# Patient Record
Sex: Female | Born: 1965 | Race: White | Hispanic: No | Marital: Married | Smoking: Never smoker
Health system: Southern US, Community
[De-identification: ages and names within clinical notes are randomized; demographics above are authoritative.]

## PROBLEM LIST (undated history)

## (undated) DIAGNOSIS — J45909 Unspecified asthma, uncomplicated: Secondary | ICD-10-CM

## (undated) DIAGNOSIS — T7840XA Allergy, unspecified, initial encounter: Secondary | ICD-10-CM

## (undated) DIAGNOSIS — E119 Type 2 diabetes mellitus without complications: Secondary | ICD-10-CM

## (undated) DIAGNOSIS — K219 Gastro-esophageal reflux disease without esophagitis: Secondary | ICD-10-CM

## (undated) DIAGNOSIS — Z8041 Family history of malignant neoplasm of ovary: Secondary | ICD-10-CM

## (undated) DIAGNOSIS — R51 Headache: Secondary | ICD-10-CM

## (undated) DIAGNOSIS — I10 Essential (primary) hypertension: Secondary | ICD-10-CM

## (undated) DIAGNOSIS — Z800 Family history of malignant neoplasm of digestive organs: Secondary | ICD-10-CM

## (undated) DIAGNOSIS — Z973 Presence of spectacles and contact lenses: Secondary | ICD-10-CM

## (undated) DIAGNOSIS — J069 Acute upper respiratory infection, unspecified: Secondary | ICD-10-CM

## (undated) DIAGNOSIS — Z8051 Family history of malignant neoplasm of kidney: Secondary | ICD-10-CM

## (undated) DIAGNOSIS — Z803 Family history of malignant neoplasm of breast: Secondary | ICD-10-CM

## (undated) HISTORY — DX: Unspecified asthma, uncomplicated: J45.909

## (undated) HISTORY — DX: Allergy, unspecified, initial encounter: T78.40XA

## (undated) HISTORY — DX: Family history of malignant neoplasm of ovary: Z80.41

## (undated) HISTORY — PX: DILATION AND CURETTAGE OF UTERUS: SHX78

## (undated) HISTORY — DX: Family history of malignant neoplasm of breast: Z80.3

## (undated) HISTORY — DX: Family history of malignant neoplasm of digestive organs: Z80.0

## (undated) HISTORY — DX: Family history of malignant neoplasm of kidney: Z80.51

## (undated) HISTORY — DX: Type 2 diabetes mellitus without complications: E11.9

## (undated) HISTORY — DX: Essential (primary) hypertension: I10

## (undated) HISTORY — PX: BREAST SURGERY: SHX581

## (undated) HISTORY — PX: ABDOMINAL HYSTERECTOMY: SHX81

## (undated) HISTORY — PX: NASAL SINUS SURGERY: SHX719

---

## 1998-04-20 ENCOUNTER — Other Ambulatory Visit: Admission: RE | Admit: 1998-04-20 | Discharge: 1998-04-20 | Payer: Self-pay | Admitting: Obstetrics & Gynecology

## 2001-03-18 ENCOUNTER — Other Ambulatory Visit: Admission: RE | Admit: 2001-03-18 | Discharge: 2001-03-18 | Payer: Self-pay | Admitting: Obstetrics and Gynecology

## 2001-05-16 ENCOUNTER — Encounter (INDEPENDENT_AMBULATORY_CARE_PROVIDER_SITE_OTHER): Payer: Self-pay | Admitting: Specialist

## 2001-05-16 ENCOUNTER — Ambulatory Visit (HOSPITAL_COMMUNITY): Admission: RE | Admit: 2001-05-16 | Discharge: 2001-05-16 | Payer: Self-pay | Admitting: Obstetrics and Gynecology

## 2002-03-27 ENCOUNTER — Other Ambulatory Visit: Admission: RE | Admit: 2002-03-27 | Discharge: 2002-03-27 | Payer: Self-pay | Admitting: Obstetrics and Gynecology

## 2003-06-04 ENCOUNTER — Other Ambulatory Visit: Admission: RE | Admit: 2003-06-04 | Discharge: 2003-06-04 | Payer: Self-pay | Admitting: Obstetrics and Gynecology

## 2003-08-12 ENCOUNTER — Ambulatory Visit (HOSPITAL_COMMUNITY): Admission: RE | Admit: 2003-08-12 | Discharge: 2003-08-12 | Payer: Self-pay | Admitting: Otolaryngology

## 2004-06-09 ENCOUNTER — Other Ambulatory Visit: Admission: RE | Admit: 2004-06-09 | Discharge: 2004-06-09 | Payer: Self-pay | Admitting: Obstetrics and Gynecology

## 2005-06-08 ENCOUNTER — Other Ambulatory Visit: Admission: RE | Admit: 2005-06-08 | Discharge: 2005-06-08 | Payer: Self-pay | Admitting: Obstetrics and Gynecology

## 2005-06-11 ENCOUNTER — Other Ambulatory Visit: Admission: RE | Admit: 2005-06-11 | Discharge: 2005-06-11 | Payer: Self-pay | Admitting: Radiology

## 2005-12-12 ENCOUNTER — Ambulatory Visit (HOSPITAL_COMMUNITY): Admission: RE | Admit: 2005-12-12 | Discharge: 2005-12-12 | Payer: Self-pay | Admitting: Pulmonary Disease

## 2007-03-04 ENCOUNTER — Ambulatory Visit (HOSPITAL_COMMUNITY): Admission: RE | Admit: 2007-03-04 | Discharge: 2007-03-04 | Payer: Self-pay | Admitting: Pulmonary Disease

## 2007-10-15 ENCOUNTER — Emergency Department (HOSPITAL_COMMUNITY): Admission: EM | Admit: 2007-10-15 | Discharge: 2007-10-15 | Payer: Self-pay | Admitting: Emergency Medicine

## 2007-10-28 ENCOUNTER — Inpatient Hospital Stay (HOSPITAL_COMMUNITY): Admission: RE | Admit: 2007-10-28 | Discharge: 2007-10-29 | Payer: Self-pay | Admitting: Obstetrics and Gynecology

## 2007-10-28 ENCOUNTER — Encounter (INDEPENDENT_AMBULATORY_CARE_PROVIDER_SITE_OTHER): Payer: Self-pay | Admitting: Obstetrics and Gynecology

## 2008-04-26 IMAGING — CR DG HAND COMPLETE 3+V*L*
3 series · 3 of 3 positions shown · non-contrast
Comparison: none

CLINICAL DATA: Left hand pain and swelling.   Injury yesterday. 
 LEFT HAND ? 3 VIEW:

[view not recorded (1 of 3)]
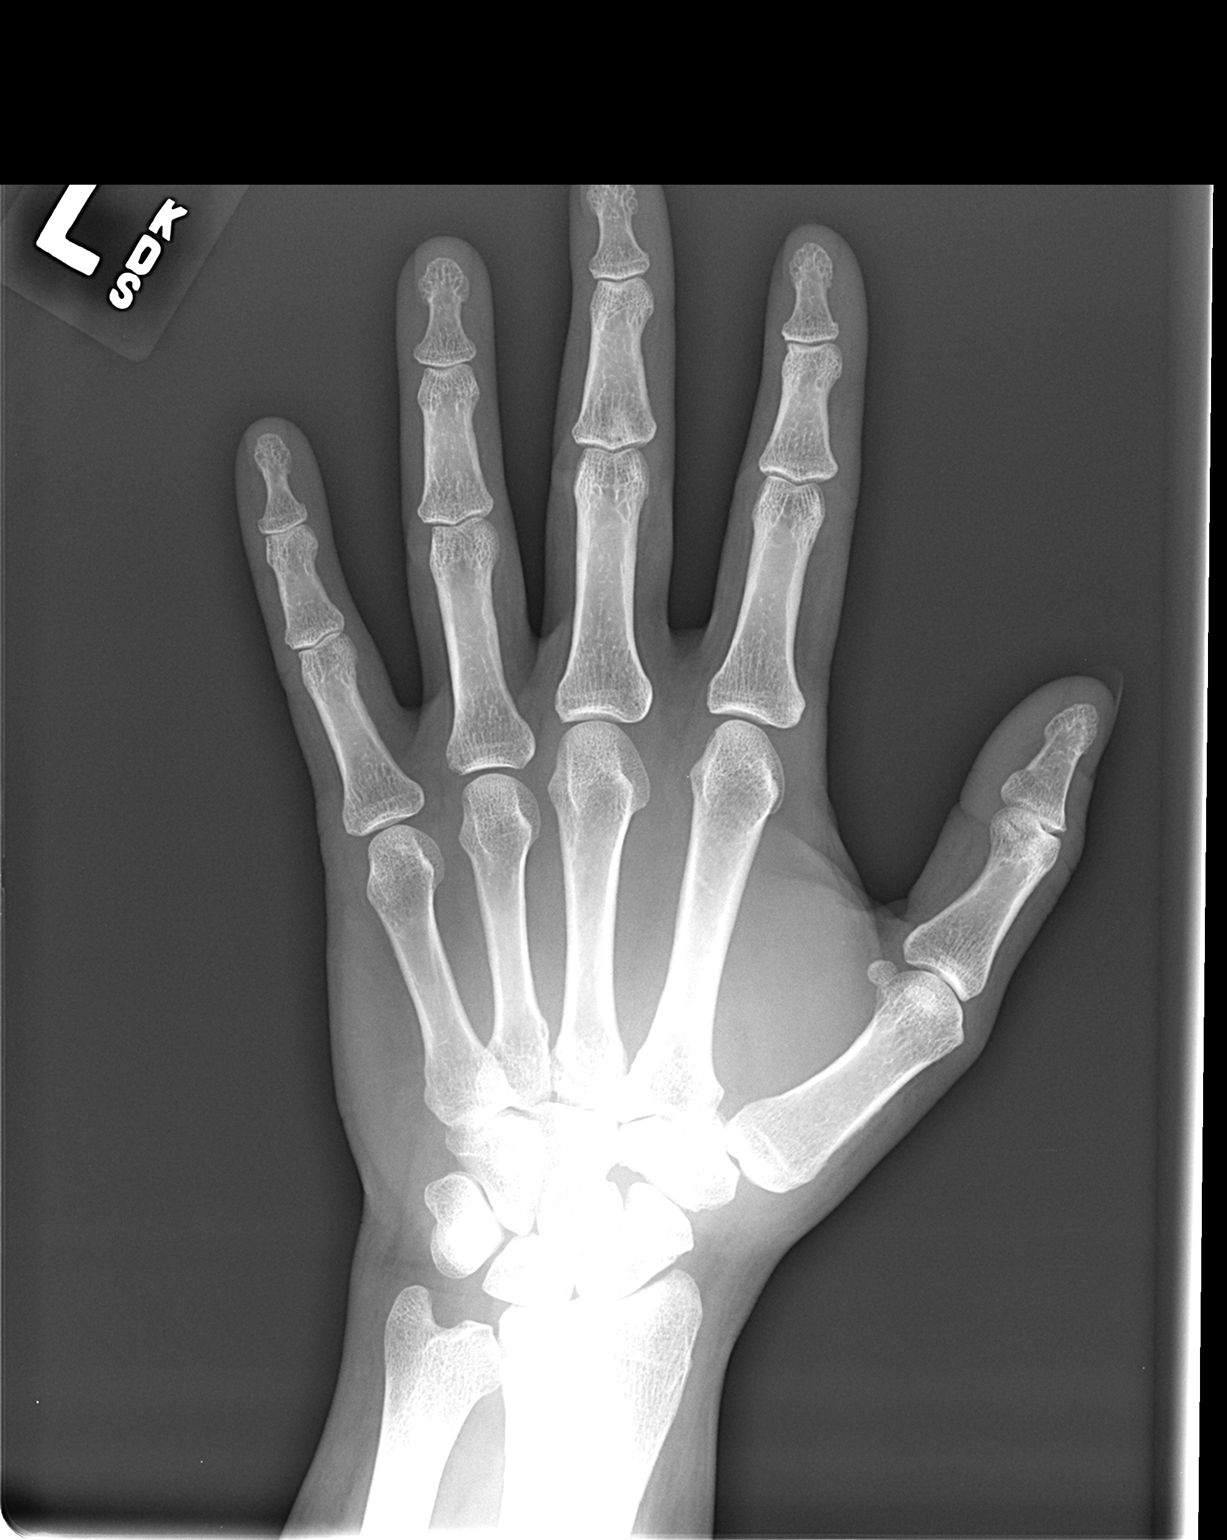

[view not recorded (2 of 3)]
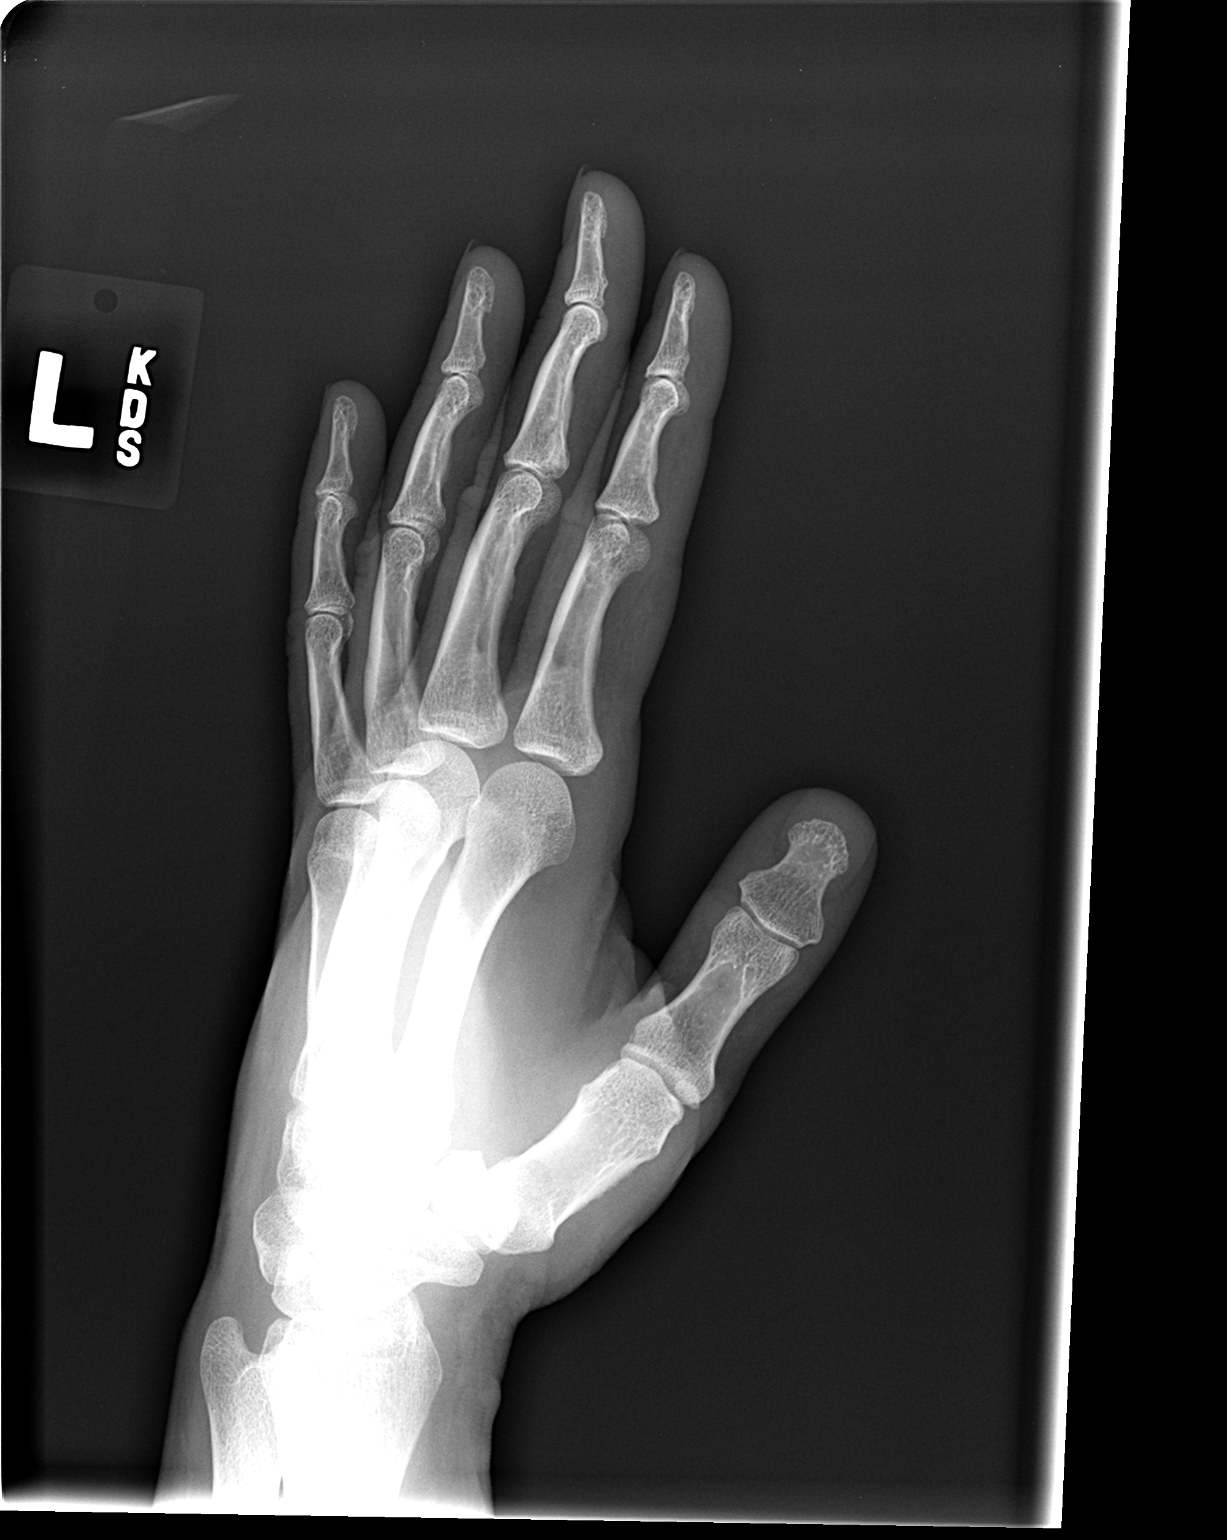

[view not recorded (3 of 3)]
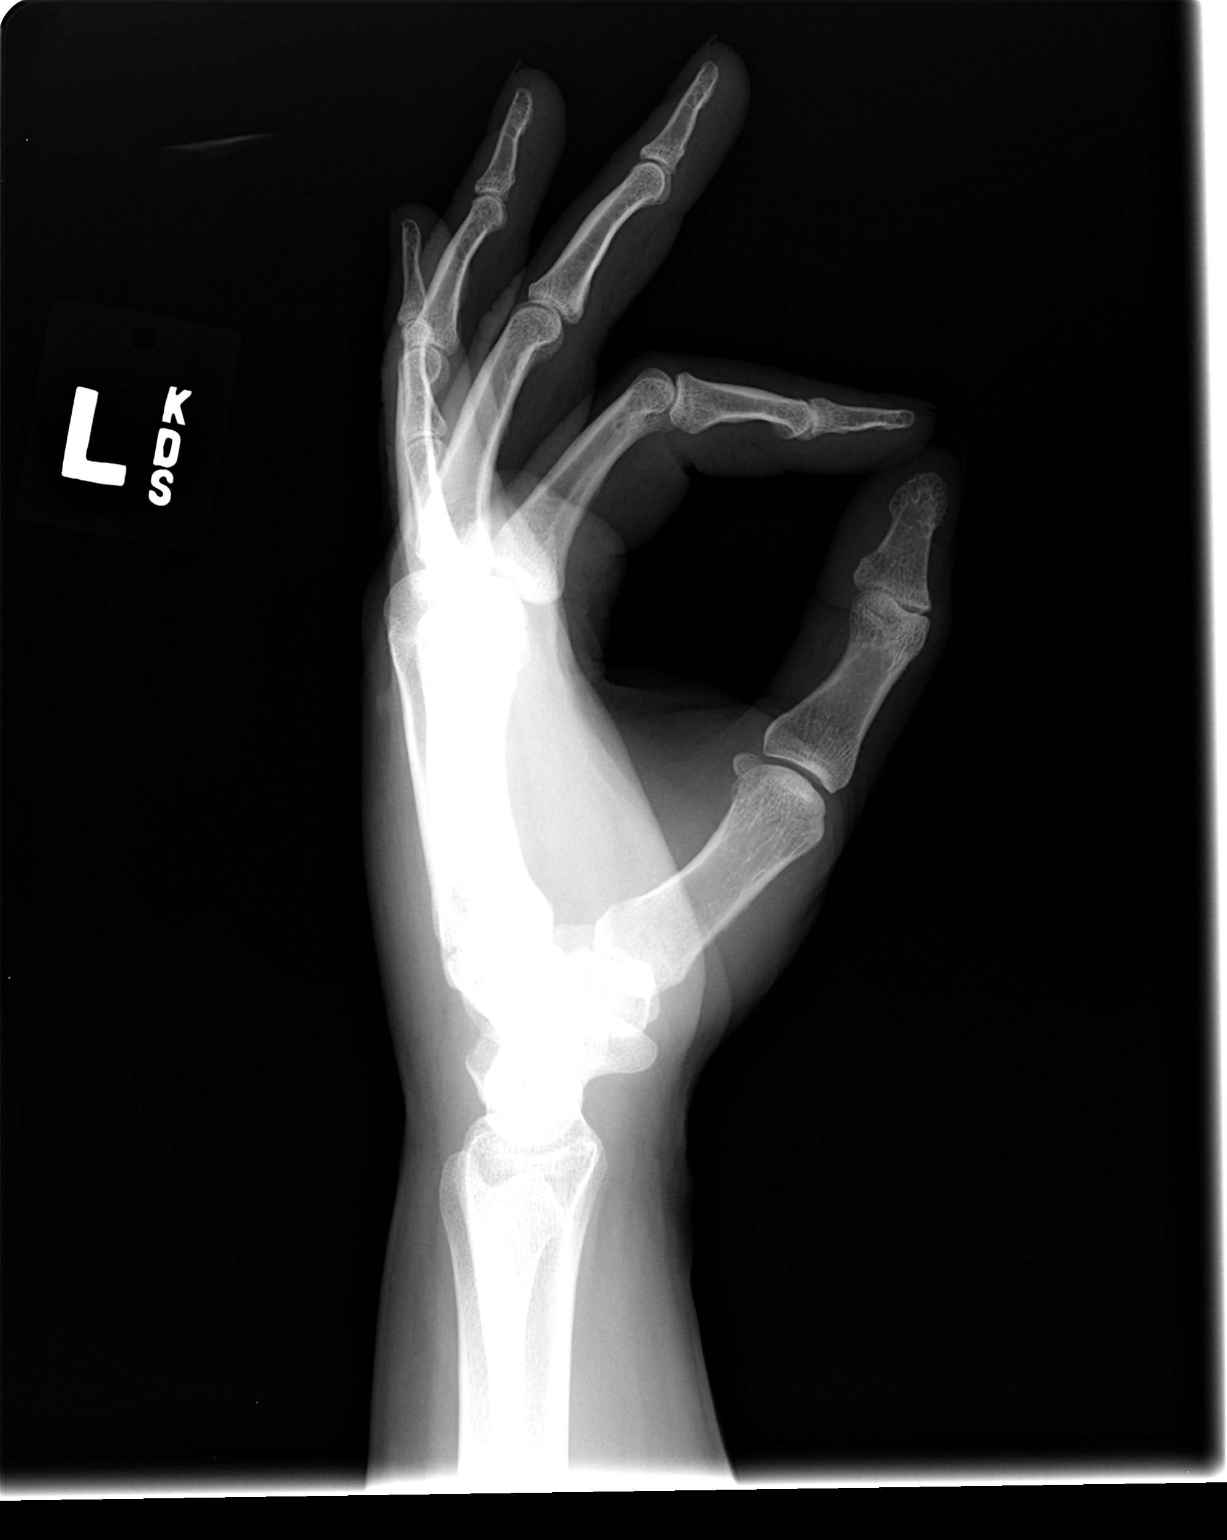

[3 of 3 positions shown; findings below may reference images not displayed]

FINDINGS: There is no evidence of fracture or dislocation.  There is no evidence of arthropathy or other focal bone abnormality.  Soft tissues are unremarkable.
IMPRESSION: Negative.

## 2009-07-05 ENCOUNTER — Ambulatory Visit (HOSPITAL_COMMUNITY): Admission: RE | Admit: 2009-07-05 | Discharge: 2009-07-05 | Payer: Self-pay | Admitting: Pulmonary Disease

## 2009-07-05 ENCOUNTER — Ambulatory Visit: Payer: Self-pay | Admitting: Cardiology

## 2009-07-05 ENCOUNTER — Encounter (INDEPENDENT_AMBULATORY_CARE_PROVIDER_SITE_OTHER): Payer: Self-pay | Admitting: Pulmonary Disease

## 2011-01-17 ENCOUNTER — Other Ambulatory Visit: Payer: Self-pay | Admitting: Radiology

## 2011-02-27 NOTE — Op Note (Signed)
NAMEARCHITA, [name]              ACCOUNT NO.:  [id number]   MEDICAL RECORD NO.:  [id number]          PATIENT TYPE:  INP   LOCATION:  9313                          FACILITY:  [facility]   PHYSICIAN:  Zenaida Niece, M.D.DATE OF BIRTH:  [birth year]   DATE OF PROCEDURE:  10/28/2007  DATE OF DISCHARGE:                               OPERATIVE REPORT   PREOPERATIVE DIAGNOSES:  Symptomatic leiomyomatous uterus.   POSTOPERATIVE DIAGNOSES:  Symptomatic leiomyomatous uterus.   PROCEDURE:  Attempted laparoscopic supracervical hysterectomy followed  by abdominal supracervical hysterectomy.   SURGEON:  Zenaida Niece, MD.   ASSISTANT:  Huel Cote, MD.   ANESTHESIA:  General endotracheal tube.   FINDINGS:  She had a 10-week size bulky uterus with multiple fibroids  and large uterine vessels.   SPECIMEN:  Uterus sent to pathology.   ESTIMATED BLOOD LOSS:  700 mL.   COMPLICATIONS:  Significant bleeding from the left uterine artery was  unable to be controlled through the laparoscope and necessitated  laparotomy.   PROCEDURE IN DETAIL:  The patient was taken to the operating room and  placed in the dorsosupine position.  General anesthesia was induced. The  legs were placed in mobile stirrups and arms were tucked to her sides.  The abdomen, perineum and vagina were then prepped and draped in the  usual sterile fashion.  A Foley catheter was inserted. Infraumbilical  skin was then infiltrated with 0.25% Marcaine and a three-quarter  centimeter vertical incision was made.  The Veress needle was inserted  into the perineal cavity and placement confirmed by the water drop test  and an opening pressure of 4 mmHg.  CO2 gas was then insufflated to a  pressure of 14 mmHg and the Veress needle was removed.  A 5-mm trocar  was then introduced with direct visualization with the laparoscope.  Inspection revealed the above-mentioned findings.  The tubes and ovaries  appeared normal.  A 5-mm  port was placed on the right side under direct  visualization and a 12-mm port on the left side also under direct  visualization.  There was a large fibroid emanating from the left  posterior fundus which I initially thought was bowel but with further  inspection it was found to be fibroid.  The uterine fundus was grasped  with a laparoscopic tenaculum.  Using the harmonic scalpel Ace, I was  able to take down the left fallopian tube, left utero-ovarian pedicle,  left round ligament and left broad ligament.  I was then able to  skeletonize the left uterine artery and incise the anterior peritoneum  across the anterior portion of the uterus.  The uterine artery was not  taken at this time.  Attention was then turned to the right side were  again the fallopian tube, utero-ovarian pedicle, round ligament and  broad ligament were taken down with the harmonic scalpel Ace.  The  anterior peritoneum was incised across the anterior portion of the  uterus to meet the incision coming from the left side.  The bladder was  pushed inferior.  The uterine artery was then skeletonized and taken  down with the harmonic scalpel Ace on minimum power.  Bleeding was  controlled with the harmonic scalpel.  There was a small amount of  bleeding from the uterus felt to come from the back bleeding.  The  pelvis was irrigated and no significant bleeding was noted.  Attention  was turned back to the left uterine artery. This was further  skeletonized and isolated and attempted to take this down with the  harmonic scalpel Ace.  The vessels were quite large.  Several bites on  minimum power did cut through some of the uterine artery.  However there  were pulsating portions of the artery which I attempted to control with  the harmonic scalpel and was unable to control this.  Thus the  laparoscopic portion was abandoned.  Rapidly a Pfannenstiel's incision  was made and the abdominal cavity was entered.  A disposable  Alexis  retractor was placed.  3-400 mL of clots were removed and towel clips  were used to grasp the uterine fundus.  Moist towels were used to pack  the bowels out of the pelvis.  The left uterine artery was finally  isolated and identified and a Heaney clamp used to stop bleeding.  The  right uterine artery was also inspected and a small amount of bleeding  was also controlled with a Heaney clamp.  Both pedicles were cut and  ligated with #0 Vicryl with adequate hemostasis.  No further significant  bleeding was noted and the pelvis was irrigated and all clots removed.  The uterus was then sharply removed from the cervix with a knife using  Kocher clamps to grasp the cervix. A small amount of bleeding was  controlled with electrocautery and the endocervical canal was also  coagulated with the Bovie.  The cervical stump was then oversewn with  three figure-of-eight sutures of #0 Vicryl with adequate closure and  adequate hemostasis.  A small area on the left posterior cervix was  bleeding and this was controlled with electrocautery.  All pedicles were  inspected.  A small amount of bleeding from the right ovary was  controlled with electrocautery.  Incisions were felt to be superior to  the ureters.  The right ureter was palpated well below any incision  line. The left ureter was difficult to visualize.  The pelvis was again  irrigated and all pedicles were felt to be hemostatic.  All packs were  removed from the abdomen.  The subfascial space was then irrigated and  made hemostatic with electrocautery. All laparoscopic trocars were  removed.  The 12 mm port on the left side had fascia closed within a  figure-of-eight suture of #0 Vicryl while we could palpate intra-  abdominal contents.  In the Pfannenstiel incision, the fascia was then  closed with a #0 Vicryl starting at both ends and meeting in the middle.  The subcutaneous tissue was then irrigated and made hemostatic with   electrocautery.  The subcutaneous tissue was then closed with running 2-  0 plain gut.  The skin was then closed with staples.  Laparoscopic skin  incisions were closed with interrupted subcuticular sutures of 4-0  Vicryl followed by Dermabond.  A sterile dressing was then applied to  the Pfannenstiel incision.  The patient was then taken down from  stirrups.  She was extubated in the operating room and taken to the  recovery room in stable condition.  Counts were correct x2, she received  Ancef 1 gram IV prior to the procedure  and had PAS on throughout the  procedure.  Despite her bleeding she remained hemodynamically stable.      Zenaida Niece, M.D.  Electronically Signed     TDM/MEDQ  D:  10/28/2007  T:  10/28/2007  Job:  [id number]

## 2011-02-27 NOTE — H&P (Signed)
[name], [name]              ACCOUNT NO.:  [id number]   MEDICAL RECORD NO.:  [id number]          PATIENT TYPE:  AMB   LOCATION:  SDC                           FACILITY:  [facility]   PHYSICIAN:  Zenaida Niece, M.D.DATE OF BIRTH:  13-Dec-1965   DATE OF ADMISSION:  10/28/2007  DATE OF DISCHARGE:                              HISTORY & PHYSICAL   CHIEF COMPLAINT:  Symptomatic fibroids.   HISTORY OF PRESENT ILLNESS:  This is a 45 year old white female, para 1-  0-0-1, with known fibroids who was seen for an annual exam in September  of 2008.  She was having regular periods every month which were  persistently heavy for a day, but she had also developed mid cycle pain  at that point.  The uterus at that time was 8-10 weeks size and slightly  irregular.  She then had increasing symptoms and called with complaints  in October of 2008.  In November of 2008, she had a saline infusion  vaginal ultrasound done which revealed at least 2 anterior intramural  fibroids, one with a small submucosal component with an otherwise normal  endometrial cavity and normal ovaries.  All nonsurgical and surgical  options have been discussed with the patient, and she wishes to undergo  definitive surgical therapy with hysterectomy and is admitted for this  at this time.   PAST OBSTETRICAL HISTORY:  One vaginal delivery at term without  complications.   PAST MEDICAL HISTORY:  1. Hypertension.  2. Asthma.   PAST SURGICAL HISTORY:  In 2002, she had a hysteroscopy D&C, endometrial  ablation and resection of an endometrial polyp.   MEDICATIONS:  1. Cozaar.  2. Generic Allegra.  3. Azmacort.   ALLERGIES:  None known.   SOCIAL HISTORY:  The patient is married and denies alcohol, tobacco or  drug use.   FAMILY HISTORY:  Maternal aunt with breast cancer.   REVIEW OF SYSTEMS:  She has normal bowel and bladder function.  No chest  pain or shortness of breath.   PHYSICAL EXAMINATION:  VITAL SIGNS:   Weight is 229 pounds, blood  pressure 124/84.  GENERAL:  This is a slightly overweight white female in no acute  distress.  NECK:  Supple without lymphadenopathy or thyromegaly.  LUNGS:  Clear to auscultation.  ABDOMEN:  Soft, nontender, nondistended, without palpable masses.  EXTREMITIES:  No edema and are nontender.  PELVIC:  External genitalia has no lesions.  On speculum exam, the  cervix is normal.  On bimanual exam, she has an anteverted uterus that  is irregular and nontender, approximately 8-10 weeks size.  This is  confirmed by rectovaginal exam.  Endometrial biopsy revealed benign  secretory endometrium.   ASSESSMENT:  Symptomatic leiomyomatous uterus.  All non-surgical and  surgical options have been discussed with the patient, and she wishes to  undergo definitive surgical therapy.  All surgical options and routes of  surgery had been discussed.  All risks of surgery had also been  discussed.   PLAN:  Plan is to admit the patient on the day of surgery for a  laparoscopic supracervical hysterectomy.  Zenaida Niece, M.D.  Electronically Signed     TDM/MEDQ  D:  10/27/2007  T:  10/27/2007  Job:  [id number]

## 2011-03-02 NOTE — Op Note (Signed)
[facility]  Patient:    [name], [name]                     MRN: [id number] Proc. Date: 05/16/01 Adm. Date:  [id number] Attending:  Michaele Offer                           Operative Report  PREOPERATIVE DIAGNOSES:  Menorrhagia and endometrial polyp.  POSTOPERATIVE DIAGNOSES:  Menometrorrhagia and endometrial polyp.  PROCEDURE:  Dilation and curettage with hysteroscopy with resection of polyp and endometrial ablation.  SURGEON:  Zenaida Niece, M.D.  ANESTHESIA:  General with an LMA.  ESTIMATED BLOOD LOSS:  Minimal.  FLUID DEFICIT:  Input and output through the hysteroscope revealed 100 cc deficit.  FINDINGS:  A long endometrial polyp and shaggy endometrium posteriorly and to the posterior right but was otherwise atrophic.  PROCEDURE IN DETAIL:  The patient was taken to the operating room and placed in the dorsal supine position.  General anesthesia was induced, and she was placed in mobile stirrups.  Her perineum and vagina were prepped and draped in the usual sterile fashion and her bladder drained with a red rubber catheter. A bivalve speculum was inserted into the vagina and the anterior lip of the cervix grasped with a single-tooth tenaculum.  The uterus sounded to approximately 9 cm, and the cervix was gradually dilated to a size 31 dilator. without difficulty.  The resectoscope was then inserted, and the speculum was removed and replaced by a weighted speculum when necessary.  Inspection of the uterine cavity through the hysteroscope revealed this polyp and shaggy endometrium.  The polyp was resected with cutting current and removed.  Sharp curettage was then performed with return of a moderate amount of endometrial tissue.  Observation again with the hysteroscope revealed no further endometrial lesions.  The hysteroscope was removed, and the roller bar was attached to the resectoscope.  The hysteroscope was again reinserted,  again with good visualization.  Endometrial ablation was performed with the roller ball and all quadrants with good blanching of the endometrium and no significant lesions or bleeding encountered.  This did appear to achieve good ablation of the entire endometrial cavity.  The hysteroscope was removed.  The single-tooth tenaculum was removed from the cervix and bleeding controlled with pressure.  The patient was awakened in the operating room and tolerated the procedure well and was taken to the recovery room in stable condition. DD:  05/16/01 TD:  05/16/01 Job: [id number]

## 2011-03-02 NOTE — Procedures (Signed)
[name], WEESNER              ACCOUNT NO.:  [id number]   MEDICAL RECORD NO.:  [id number]          PATIENT TYPE:  OUT   LOCATION:  RESP                          FACILITY:  [facility]   PHYSICIAN:  Edward L. Juanetta Gosling, M.D.DATE OF BIRTH:  December 31, 1965   DATE OF PROCEDURE:  12/12/2005  DATE OF DISCHARGE:                              PULMONARY FUNCTION TEST   1.  Spirometry shows airflow obstruction at the level of the smaller airway      and at the larger airways as well.  2.  There is significant bronchodilator improvement.  3.  This is consistent with a clinical diagnosis of asthma.      Edward L. Juanetta Gosling, M.D.  Electronically Signed     ELH/MEDQ  D:  12/12/2005  T:  12/13/2005  Job:  [id number]

## 2011-07-05 LAB — CBC
HCT: 34.1 — ABNORMAL LOW
HCT: 38
HCT: 41.1
Hemoglobin: 11.4 — ABNORMAL LOW
Hemoglobin: 12.8
Hemoglobin: 13.9
MCHC: 33.3
MCHC: 33.7
MCHC: 33.8
MCV: 83.7
MCV: 83.8
MCV: 85.1
Platelets: 239
Platelets: 249
Platelets: 285
RBC: 4.01
RBC: 4.53
RBC: 4.91
RDW: 13.7
RDW: 14
RDW: 14.1
WBC: 12.6 — ABNORMAL HIGH
WBC: 4.3
WBC: 9.3

## 2011-07-05 LAB — CROSSMATCH
ABO/RH(D): O NEG
Antibody Screen: NEGATIVE

## 2011-07-05 LAB — BASIC METABOLIC PANEL
BUN: 15
CO2: 33 — ABNORMAL HIGH
Calcium: 10
Chloride: 104
Creatinine, Ser: 0.71
GFR calc Af Amer: >60
GFR calc non Af Amer: >60
Glucose, Bld: 103 — ABNORMAL HIGH
Potassium: 4.1
Sodium: 140

## 2011-07-05 LAB — PREGNANCY, URINE: Preg Test, Ur: NEGATIVE

## 2011-07-05 LAB — ABO/RH: ABO/RH(D): O NEG

## 2011-08-01 ENCOUNTER — Emergency Department (HOSPITAL_COMMUNITY)
Admission: EM | Admit: 2011-08-01 | Discharge: 2011-08-01 | Disposition: A | Discharge status: Home / Self Care | Payer: Self-pay | Attending: Emergency Medicine | Admitting: Emergency Medicine

## 2011-08-01 DIAGNOSIS — L509 Urticaria, unspecified: Secondary | ICD-10-CM | POA: Insufficient documentation

## 2011-08-01 NOTE — ED Notes (Signed)
Hives all over the body no respiratory compromise

## 2012-02-25 ENCOUNTER — Encounter (INDEPENDENT_AMBULATORY_CARE_PROVIDER_SITE_OTHER): Payer: Self-pay | Admitting: Surgery

## 2012-02-25 ENCOUNTER — Ambulatory Visit (INDEPENDENT_AMBULATORY_CARE_PROVIDER_SITE_OTHER): Payer: 59 | Admitting: Surgery

## 2012-02-25 VITALS — BP 154/103 | HR 84 | Temp 98.6°F | Resp 18 | Ht 67.0 in | Wt 230.4 lb

## 2012-02-25 DIAGNOSIS — R928 Other abnormal and inconclusive findings on diagnostic imaging of breast: Secondary | ICD-10-CM

## 2012-02-25 DIAGNOSIS — R921 Mammographic calcification found on diagnostic imaging of breast: Secondary | ICD-10-CM

## 2012-02-25 HISTORY — DX: Mammographic calcification found on diagnostic imaging of breast: R92.1

## 2012-02-25 NOTE — Progress Notes (Signed)
Patient ID: [name], female   DOB: January 18, 1966, 46 y.o.   MRN: [id number]  Chief Complaint  Patient presents with  . Other    calcifications/radial scar left breast    HPI [name] is a 46 y.o. female.  She is referred by Dr. Margy Clarks and Dr. Juanetta Gosling for consideration for a needle localized left breast lumpectomy. She had a biopsy last year which showed a radial scar and calcifications. These are unchanged on the shears mammogram but given the diagnosis and her family history breast cancer in her mother, removal of this area is recommended. She has no complaints. She has no problems regarding her breast. She denies nipple discharge. HPI  Past Medical History  Diagnosis Date  . Hypertension   . Asthma     History reviewed. No pertinent past surgical history.  History reviewed. No pertinent family history.  Social History History  Substance Use Topics  . Smoking status: Never Smoker   . Smokeless tobacco: Not on file  . Alcohol Use: No    Allergies  Allergen Reactions  . Cefoxitin Hives and Itching    No current outpatient prescriptions on file.    Review of Systems Review of Systems  Constitutional: Negative for fever, chills and unexpected weight change.  HENT: Negative for hearing loss, congestion, sore throat, trouble swallowing and voice change.   Eyes: Negative for visual disturbance.  Respiratory: Negative for cough and wheezing.   Cardiovascular: Negative for chest pain, palpitations and leg swelling.  Gastrointestinal: Negative for nausea, vomiting, abdominal pain, diarrhea, constipation, blood in stool, abdominal distention and anal bleeding.  Genitourinary: Negative for hematuria, vaginal bleeding and difficulty urinating.  Musculoskeletal: Negative for arthralgias.  Skin: Negative for rash and wound.  Neurological: Negative for seizures, syncope and headaches.  Hematological: Negative for adenopathy. Does not bruise/bleed easily.    Psychiatric/Behavioral: Negative for confusion.    Blood pressure 154/103, pulse 84, temperature 98.6 F (37 C), temperature source Temporal, resp. rate 18, height 5\' 7"  (1.702 m), weight 230 lb 6.4 oz (104.509 kg).  Physical Exam Physical Exam  Constitutional: She appears well-developed and well-nourished.  HENT:  Head: Normocephalic and atraumatic.  Right Ear: External ear normal.  Left Ear: External ear normal.  Nose: Nose normal.  Mouth/Throat: Oropharynx is clear and moist.  Eyes: Conjunctivae are normal. Pupils are equal, round, and reactive to light. Right eye exhibits no discharge. Left eye exhibits no discharge. No scleral icterus.  Neck: Normal range of motion. Neck supple. No tracheal deviation present. No thyromegaly present.  Cardiovascular: Normal rate, regular rhythm, normal heart sounds and intact distal pulses.  Exam reveals no gallop.   No murmur heard. Pulmonary/Chest: Effort normal and breath sounds normal. No respiratory distress. She has no wheezes.  Lymphadenopathy:    She has no cervical adenopathy.    She has no axillary adenopathy.  Skin: Skin is dry. No rash noted. She is not diaphoretic. No erythema.  Psychiatric: Her behavior is normal.  There are no palpable masses in either breast. Areola are normal  Data Reviewed I had the patient's mammograms and pathology which I have reviewed  Assessment    Left breast calcifications and radial scar    Plan    Needle localized lumpectomy is recommended. I discussed this with the patient and her husband in detail. I discussed the risk of surgery which includes but is not limited to bleeding, infection, recurrence, need for further surgery should malignancy be present, et Karie Soda. She understands and  wishes to proceed. Surgery will be scheduled. Likelihood of success is good       [name] A 02/25/2012, 3:37 PM

## 2012-03-06 ENCOUNTER — Encounter (HOSPITAL_COMMUNITY): Payer: Self-pay | Admitting: Pharmacy Technician

## 2012-03-07 ENCOUNTER — Encounter (HOSPITAL_COMMUNITY): Payer: Self-pay

## 2012-03-11 ENCOUNTER — Inpatient Hospital Stay (HOSPITAL_COMMUNITY): Admission: RE | Admit: 2012-03-11 | Discharge: 2012-03-11 | Payer: 59 | Source: Ambulatory Visit

## 2012-03-11 ENCOUNTER — Encounter (HOSPITAL_COMMUNITY): Payer: Self-pay

## 2012-03-11 HISTORY — DX: Acute upper respiratory infection, unspecified: J06.9

## 2012-03-11 HISTORY — DX: Headache: R51

## 2012-03-11 NOTE — Progress Notes (Signed)
Echo in epic from 10, req'd ekg from dr Juanetta Gosling

## 2012-03-14 ENCOUNTER — Ambulatory Visit (HOSPITAL_COMMUNITY)
Admission: RE | Admit: 2012-03-14 | Discharge: 2012-03-14 | Disposition: A | Discharge status: Home / Self Care | Payer: 59 | Source: Ambulatory Visit | Attending: Surgery | Admitting: Surgery

## 2012-03-14 ENCOUNTER — Encounter (HOSPITAL_COMMUNITY)
Admission: RE | Admit: 2012-03-14 | Discharge: 2012-03-14 | Disposition: A | Discharge status: Home / Self Care | Payer: 59 | Source: Ambulatory Visit | Attending: Surgery | Admitting: Surgery

## 2012-03-14 DIAGNOSIS — Z01818 Encounter for other preprocedural examination: Secondary | ICD-10-CM | POA: Insufficient documentation

## 2012-03-14 LAB — BASIC METABOLIC PANEL
Calcium: 10.3 mg/dL (ref 8.4–10.5)
GFR calc non Af Amer: >90 mL/min (ref >90)
Sodium: 138 mEq/L (ref 135–145)

## 2012-03-14 LAB — CBC
MCH: 28.1 pg (ref 26.0–34.0)
Platelets: 317 10*3/uL (ref 150–400)
RBC: 5.12 MIL/uL — ABNORMAL HIGH (ref 3.87–5.11)
RDW: 13.7 % (ref 11.5–15.5)
WBC: 10.9 10*3/uL — ABNORMAL HIGH (ref 4.0–10.5)

## 2012-03-14 LAB — SURGICAL PCR SCREEN: MRSA, PCR: NEGATIVE

## 2012-03-14 NOTE — Pre-Procedure Instructions (Signed)
20 [name]  03/14/2012   Your procedure is scheduled on:  Fri, June 7 @ 10:45 AM  Report to [facility] at 7:45 AM.  Call this number if you have problems the morning of surgery: [phone]   Remember:   Do not eat food:After Midnight.  May have clear liquids: up to 4 Hours before arrival.(until 3:45 AM)  Clear liquids include soda, tea, black coffee, apple or grape juice, broth,water  Take these medicines the morning of surgery with A SIP OF WATER: Albuterol<Bring Your Inhaler With You> and Cetirizine(Zyrtec)   Do not wear jewelry, make-up or nail polish.  Do not wear lotions, powders, or perfumes.   Do not shave 48 hours prior to surgery.   Do not bring valuables to the hospital.  Contacts, dentures or bridgework may not be worn into surgery.  Leave suitcase in the car. After surgery it may be brought to your room.  For patients admitted to the hospital, checkout time is 11:00 AM the day of discharge.   Special Instructions: CHG Shower Use Special Wash: 1/2 bottle night before surgery and 1/2 bottle morning of surgery.   Please read over the following fact sheets that you were given: Pain Booklet, Coughing and Deep Breathing, MRSA Information and Surgical Site Infection Prevention

## 2012-03-20 MED ORDER — CIPROFLOXACIN IN D5W 400 MG/200ML IV SOLN
400.0000 mg | INTRAVENOUS | Status: AC
  Administered 2012-03-21: 400 mg via INTRAVENOUS
  Filled 2012-03-20: qty 200

## 2012-03-20 NOTE — H&P (Signed)
Patient ID: [name], female DOB: [birth year], 46 y.o. MRN: [id number]  Chief Complaint   Patient presents with   .  Other     calcifications/radial scar left breast    HPI  [name] is a 46 y.o. female. She is referred by Dr. Margy Clarks and Dr. Juanetta Gosling for consideration for a needle localized left breast lumpectomy. She had a biopsy last year which showed a radial scar and calcifications. These are unchanged on the shears mammogram but given the diagnosis and her family history breast cancer in her mother, removal of this area is recommended. She has no complaints. She has no problems regarding her breast. She denies nipple discharge.  HPI  Past Medical History   Diagnosis  Date   .  Hypertension    .  Asthma     History reviewed. No pertinent past surgical history.  History reviewed. No pertinent family history.  Social History  History   Substance Use Topics   .  Smoking status:  Never Smoker   .  Smokeless tobacco:  Not on file   .  Alcohol Use:  No    Allergies   Allergen  Reactions   .  Cefoxitin  Hives and Itching    No current outpatient prescriptions on file.    Review of Systems  Review of Systems  Constitutional: Negative for fever, chills and unexpected weight change.  HENT: Negative for hearing loss, congestion, sore throat, trouble swallowing and voice change.  Eyes: Negative for visual disturbance.  Respiratory: Negative for cough and wheezing.  Cardiovascular: Negative for chest pain, palpitations and leg swelling.  Gastrointestinal: Negative for nausea, vomiting, abdominal pain, diarrhea, constipation, blood in stool, abdominal distention and anal bleeding.  Genitourinary: Negative for hematuria, vaginal bleeding and difficulty urinating.  Musculoskeletal: Negative for arthralgias.  Skin: Negative for rash and wound.  Neurological: Negative for seizures, syncope and headaches.  Hematological: Negative for adenopathy. Does not bruise/bleed easily.    Psychiatric/Behavioral: Negative for confusion.   Blood pressure 154/103, pulse 84, temperature 98.6 F (37 C), temperature source Temporal, resp. rate 18, height 5\' 7"  (1.702 m), weight 230 lb 6.4 oz (104.509 kg).  Physical Exam  Physical Exam  Constitutional: She appears well-developed and well-nourished.  HENT:  Head: Normocephalic and atraumatic.  Right Ear: External ear normal.  Left Ear: External ear normal.  Nose: Nose normal.  Mouth/Throat: Oropharynx is clear and moist.  Eyes: Conjunctivae are normal. Pupils are equal, round, and reactive to light. Right eye exhibits no discharge. Left eye exhibits no discharge. No scleral icterus.  Neck: Normal range of motion. Neck supple. No tracheal deviation present. No thyromegaly present.  Cardiovascular: Normal rate, regular rhythm, normal heart sounds and intact distal pulses. Exam reveals no gallop.  No murmur heard.  Pulmonary/Chest: Effort normal and breath sounds normal. No respiratory distress. She has no wheezes.  Lymphadenopathy:  She has no cervical adenopathy.  She has no axillary adenopathy.  Skin: Skin is dry. No rash noted. She is not diaphoretic. No erythema.  Psychiatric: Her behavior is normal.  There are no palpable masses in either breast. Areola are normal  Data Reviewed  I had the patient's mammograms and pathology which I have reviewed  Assessment   Left breast calcifications and radial scar   Plan   Needle localized lumpectomy is recommended. I discussed this with the patient and her husband in detail. I discussed the risk of surgery which includes but is not limited to bleeding,  infection, recurrence, need for further surgery should malignancy be present, et Karie Soda. She understands and wishes to proceed. Surgery will be scheduled. Likelihood of success is good   Kamdyn Colborn A

## 2012-03-21 ENCOUNTER — Encounter (HOSPITAL_COMMUNITY): Payer: Self-pay | Admitting: Anesthesiology

## 2012-03-21 ENCOUNTER — Encounter (HOSPITAL_COMMUNITY)
Admission: RE | Disposition: A | Discharge status: Home / Self Care | Payer: Self-pay | Source: Ambulatory Visit | Attending: Surgery

## 2012-03-21 ENCOUNTER — Ambulatory Visit (HOSPITAL_COMMUNITY)
Admission: RE | Admit: 2012-03-21 | Discharge: 2012-03-21 | Disposition: A | Discharge status: Home / Self Care | Payer: 59 | Source: Ambulatory Visit | Attending: Surgery | Admitting: Surgery

## 2012-03-21 ENCOUNTER — Ambulatory Visit (HOSPITAL_COMMUNITY): Payer: 59 | Admitting: Anesthesiology

## 2012-03-21 DIAGNOSIS — R51 Headache: Secondary | ICD-10-CM | POA: Insufficient documentation

## 2012-03-21 DIAGNOSIS — N6089 Other benign mammary dysplasias of unspecified breast: Secondary | ICD-10-CM | POA: Insufficient documentation

## 2012-03-21 DIAGNOSIS — R928 Other abnormal and inconclusive findings on diagnostic imaging of breast: Secondary | ICD-10-CM | POA: Insufficient documentation

## 2012-03-21 DIAGNOSIS — J45909 Unspecified asthma, uncomplicated: Secondary | ICD-10-CM | POA: Insufficient documentation

## 2012-03-21 DIAGNOSIS — N6019 Diffuse cystic mastopathy of unspecified breast: Secondary | ICD-10-CM

## 2012-03-21 DIAGNOSIS — L905 Scar conditions and fibrosis of skin: Secondary | ICD-10-CM | POA: Insufficient documentation

## 2012-03-21 DIAGNOSIS — Z803 Family history of malignant neoplasm of breast: Secondary | ICD-10-CM | POA: Insufficient documentation

## 2012-03-21 DIAGNOSIS — N6029 Fibroadenosis of unspecified breast: Secondary | ICD-10-CM | POA: Insufficient documentation

## 2012-03-21 SURGERY — BREAST LUMPECTOMY WITH NEEDLE LOCALIZATION
Anesthesia: Monitor Anesthesia Care | Site: Breast | Laterality: Left | Wound class: Clean

## 2012-03-21 MED ORDER — BUPIVACAINE-EPINEPHRINE 0.25% -1:200000 IJ SOLN
INTRAMUSCULAR | Status: DC | PRN
  Administered 2012-03-21: 20 mL

## 2012-03-21 MED ORDER — LACTATED RINGERS IV SOLN
INTRAVENOUS | Status: DC
  Administered 2012-03-21: 12:00:00 via INTRAVENOUS

## 2012-03-21 MED ORDER — OXYCODONE HCL 5 MG PO TABS
5.0000 mg | ORAL_TABLET | ORAL | Status: DC | PRN
  Administered 2012-03-21: 5 mg via ORAL

## 2012-03-21 MED ORDER — ONDANSETRON HCL 4 MG/2ML IJ SOLN
INTRAMUSCULAR | Status: DC | PRN
  Administered 2012-03-21: 4 mg via INTRAVENOUS

## 2012-03-21 MED ORDER — PROPOFOL 10 MG/ML IV EMUL
INTRAVENOUS | Status: DC | PRN
  Administered 2012-03-21: 200 mg via INTRAVENOUS

## 2012-03-21 MED ORDER — FENTANYL CITRATE 0.05 MG/ML IJ SOLN: 25.0000 ug | INTRAMUSCULAR | Status: DC | PRN

## 2012-03-21 MED ORDER — ONDANSETRON HCL 4 MG/2ML IJ SOLN: 4.0000 mg | Freq: Four times a day (QID) | INTRAMUSCULAR | Status: DC | PRN

## 2012-03-21 MED ORDER — MIDAZOLAM HCL 5 MG/5ML IJ SOLN
INTRAMUSCULAR | Status: DC | PRN
  Administered 2012-03-21: 2 mg via INTRAVENOUS

## 2012-03-21 MED ORDER — LACTATED RINGERS IV SOLN
INTRAVENOUS | Status: DC | PRN
  Administered 2012-03-21 (×2): via INTRAVENOUS

## 2012-03-21 MED ORDER — FENTANYL CITRATE 0.05 MG/ML IJ SOLN
INTRAMUSCULAR | Status: DC | PRN
  Administered 2012-03-21 (×3): 50 ug via INTRAVENOUS

## 2012-03-21 MED ORDER — MORPHINE SULFATE 2 MG/ML IJ SOLN: 2.0000 mg | INTRAMUSCULAR | Status: DC | PRN

## 2012-03-21 MED ORDER — 0.9 % SODIUM CHLORIDE (POUR BTL) OPTIME
TOPICAL | Status: DC | PRN
  Administered 2012-03-21: 1000 mL

## 2012-03-21 MED ORDER — LIDOCAINE HCL (CARDIAC) 20 MG/ML IV SOLN
INTRAVENOUS | Status: DC | PRN
  Administered 2012-03-21: 50 mg via INTRAVENOUS

## 2012-03-21 MED ORDER — HYDROCODONE-ACETAMINOPHEN 5-325 MG PO TABS: 1.0000 | ORAL_TABLET | ORAL | Status: AC | PRN

## 2012-03-21 MED ORDER — OXYCODONE HCL 5 MG PO TABS
ORAL_TABLET | ORAL | Status: AC
  Filled 2012-03-21: qty 1

## 2012-03-21 SURGICAL SUPPLY — 42 items
BENZOIN TINCTURE PRP APPL 2/3 (GAUZE/BANDAGES/DRESSINGS) ×2 IMPLANT
BLADE SURG 10 STRL SS (BLADE) ×2 IMPLANT
BLADE SURG 15 STRL LF DISP TIS (BLADE) ×1 IMPLANT
BLADE SURG 15 STRL SS (BLADE) ×1
CANISTER SUCTION 2500CC (MISCELLANEOUS) IMPLANT
CHLORAPREP W/TINT 26ML (MISCELLANEOUS) ×2 IMPLANT
CLOTH BEACON ORANGE TIMEOUT ST (SAFETY) ×2 IMPLANT
COVER SURGICAL LIGHT HANDLE (MISCELLANEOUS) ×2 IMPLANT
DEVICE DUBIN SPECIMEN MAMMOGRA (MISCELLANEOUS) ×2 IMPLANT
DRAPE CHEST BREAST 15X10 FENES (DRAPES) ×2 IMPLANT
DRSG TEGADERM 4X4.75 (GAUZE/BANDAGES/DRESSINGS) ×2 IMPLANT
ELECT CAUTERY BLADE 6.4 (BLADE) ×2 IMPLANT
ELECT REM PT RETURN 9FT ADLT (ELECTROSURGICAL) ×2
ELECTRODE REM PT RTRN 9FT ADLT (ELECTROSURGICAL) ×1 IMPLANT
GLOVE BIOGEL PI IND STRL 7.0 (GLOVE) ×1 IMPLANT
GLOVE BIOGEL PI IND STRL 7.5 (GLOVE) ×1 IMPLANT
GLOVE BIOGEL PI INDICATOR 7.0 (GLOVE) ×1
GLOVE BIOGEL PI INDICATOR 7.5 (GLOVE) ×1
GLOVE SURG SIGNA 7.5 PF LTX (GLOVE) ×2 IMPLANT
GLOVE SURG SS PI 7.0 STRL IVOR (GLOVE) ×2 IMPLANT
GOWN PREVENTION PLUS XLARGE (GOWN DISPOSABLE) ×2 IMPLANT
GOWN STRL NON-REIN LRG LVL3 (GOWN DISPOSABLE) ×2 IMPLANT
KIT BASIN OR (CUSTOM PROCEDURE TRAY) ×2 IMPLANT
KIT MARKER MARGIN INK (KITS) ×2 IMPLANT
KIT ROOM TURNOVER OR (KITS) ×2 IMPLANT
NS IRRIG 1000ML POUR BTL (IV SOLUTION) ×2 IMPLANT
PACK SURGICAL SETUP 50X90 (CUSTOM PROCEDURE TRAY) ×2 IMPLANT
PAD ARMBOARD 7.5X6 YLW CONV (MISCELLANEOUS) ×2 IMPLANT
PENCIL BUTTON HOLSTER BLD 10FT (ELECTRODE) ×2 IMPLANT
SPONGE GAUZE 4X4 12PLY (GAUZE/BANDAGES/DRESSINGS) ×2 IMPLANT
SPONGE LAP 4X18 X RAY DECT (DISPOSABLE) ×2 IMPLANT
STRIP CLOSURE SKIN 1/2X4 (GAUZE/BANDAGES/DRESSINGS) ×2 IMPLANT
SUT MON AB 4-0 PC3 18 (SUTURE) ×2 IMPLANT
SUT SILK 2 0 SH (SUTURE) IMPLANT
SUT VIC AB 3-0 SH 27 (SUTURE) ×1
SUT VIC AB 3-0 SH 27XBRD (SUTURE) ×1 IMPLANT
SYR BULB 3OZ (MISCELLANEOUS) ×2 IMPLANT
SYR CONTROL 10ML LL (SYRINGE) ×2 IMPLANT
TOWEL OR 17X24 6PK STRL BLUE (TOWEL DISPOSABLE) ×2 IMPLANT
TOWEL OR 17X26 10 PK STRL BLUE (TOWEL DISPOSABLE) ×2 IMPLANT
TUBE CONNECTING 12X1/4 (SUCTIONS) IMPLANT
YANKAUER SUCT BULB TIP NO VENT (SUCTIONS) IMPLANT

## 2012-03-21 NOTE — Interval H&P Note (Signed)
History and Physical Interval Note:  No change in H and P  03/21/2012 10:52 AM  [name]  has presented today for surgery, with the diagnosis of Left breast calcifications  The various methods of treatment have been discussed with the patient and family. After consideration of risks, benefits and other options for treatment, the patient has consented to  Procedure(s) (LRB): BREAST LUMPECTOMY WITH NEEDLE LOCALIZATION (Left) as a surgical intervention .  The patients' history has been reviewed, patient examined, no change in status, stable for surgery.  I have reviewed the patients' chart and labs.  Questions were answered to the patient's satisfaction.     Shogo Larkey A

## 2012-03-21 NOTE — Anesthesia Postprocedure Evaluation (Signed)
Anesthesia Post Note  Patient: [name]  Procedure(s) Performed: Procedure(s) (LRB): BREAST LUMPECTOMY WITH NEEDLE LOCALIZATION (Left)  Anesthesia type: General  Patient location: PACU  Post pain: Pain level controlled and Adequate analgesia  Post assessment: Post-op Vital signs reviewed, Patient's Cardiovascular Status Stable, Respiratory Function Stable, Patent Airway and Pain level controlled  Last Vitals:  Filed Vitals:   03/21/12 1316  BP:   Pulse:   Temp: 36.3 C  Resp:     Post vital signs: Reviewed and stable  Level of consciousness: awake, alert  and oriented  Complications: No apparent anesthesia complications

## 2012-03-21 NOTE — Discharge Instructions (Signed)
[facility] °Office Phone Number [phone] ° °BREAST BIOPSY/ PARTIAL MASTECTOMY: POST OP INSTRUCTIONS ° °Always review your discharge instruction sheet given to you by the facility where your surgery was performed. ° °IF YOU HAVE DISABILITY OR FAMILY LEAVE FORMS, YOU MUST BRING THEM TO THE OFFICE FOR PROCESSING.  DO NOT GIVE THEM TO YOUR DOCTOR. ° °1. A prescription for pain medication may be given to you upon discharge.  Take your pain medication as prescribed, if needed.  If narcotic pain medicine is not needed, then you may take acetaminophen (Tylenol) or ibuprofen (Advil) as needed. °2. Take your usually prescribed medications unless otherwise directed °3. If you need a refill on your pain medication, please contact your pharmacy.  They will contact our office to request authorization.  Prescriptions will not be filled after 5pm or on week-ends. °4. You should eat very light the first 24 hours after surgery, such as soup, crackers, pudding, etc.  Resume your normal diet the day after surgery. °5. Most patients will experience some swelling and bruising in the breast.  Ice packs and a good support bra will help.  Swelling and bruising can take several days to resolve.  °6. It is common to experience some constipation if taking pain medication after surgery.  Increasing fluid intake and taking a stool softener will usually help or prevent this problem from occurring.  A mild laxative (Milk of Magnesia or Miralax) should be taken according to package directions if there are no bowel movements after 48 hours. °7. Unless discharge instructions indicate otherwise, you may remove your bandages 24-48 hours after surgery, and you may shower at that time.  You may have steri-strips (small skin tapes) in place directly over the incision.  These strips should be left on the skin for 7-10 days.  If your surgeon used skin glue on the incision, you may shower in 24 hours.  The glue will flake off over the  next 2-3 weeks.  Any sutures or staples will be removed at the office during your follow-up visit. °8. ACTIVITIES:  You may resume regular daily activities (gradually increasing) beginning the next day.  Wearing a good support bra or sports bra minimizes pain and swelling.  You may have sexual intercourse when it is comfortable. °a. You may drive when you no longer are taking prescription pain medication, you can comfortably wear a seatbelt, and you can safely maneuver your car and apply brakes. °b. RETURN TO WORK:  ______________________________________________________________________________________ °9. You should see your doctor in the office for a follow-up appointment approximately two weeks after your surgery.  Your doctor’s nurse will typically make your follow-up appointment when she calls you with your pathology report.  Expect your pathology report 2-3 business days after your surgery.  You may call to check if you do not hear from us after three days. °10. OTHER INSTRUCTIONS: _______________________________________________________________________________________________ _____________________________________________________________________________________________________________________________________ °_____________________________________________________________________________________________________________________________________ °_____________________________________________________________________________________________________________________________________ ° °WHEN TO CALL YOUR DOCTOR: °1. Fever over 101.0 °2. Nausea and/or vomiting. °3. Extreme swelling or bruising. °4. Continued bleeding from incision. °5. Increased pain, redness, or drainage from the incision. ° °The clinic staff is available to answer your questions during regular business hours.  Please don’t hesitate to call and ask to speak to one of the nurses for clinical concerns.  If you have a medical emergency, go to the nearest  emergency room or call 911.  A surgeon from [facility] is always on call at the hospital. ° °For further questions, please visit [email or web address]  °

## 2012-03-21 NOTE — Preoperative (Signed)
Beta Blockers   Reason not to administer Beta Blockers:Not Applicable. No home beta blockers 

## 2012-03-21 NOTE — Transfer of Care (Signed)
Immediate Anesthesia Transfer of Care Note  Patient: [name]  Procedure(s) Performed: Procedure(s) (LRB): BREAST LUMPECTOMY WITH NEEDLE LOCALIZATION (Left)  Patient Location: PACU  Anesthesia Type: General  Level of Consciousness: awake and alert   Airway & Oxygen Therapy: Patient Spontanous Breathing  Post-op Assessment: Report given to PACU RN and Post -op Vital signs reviewed and stable  Post vital signs: Reviewed and stable  Complications: No apparent anesthesia complications

## 2012-03-21 NOTE — Progress Notes (Signed)
Echo in epic 

## 2012-03-21 NOTE — Op Note (Signed)
BREAST LUMPECTOMY WITH NEEDLE LOCALIZATION  Procedure Note  JAMISON YUHASZ 03/21/2012   Pre-op Diagnosis: Left breast calcifications     Post-op Diagnosis: same  Procedure(s):  LEFT BREAST LUMPECTOMY WITH NEEDLE LOCALIZATION  Surgeon(s): Shelly Rubenstein, MD  Anesthesia: Choice  Staff:  Doy Mince, RN - Circulator Ann Mal Amabile - Scrub Person Garret Reddish, RN - Circulator Assistant  Estimated Blood Loss: Minimal               Specimens: LEFT BREAST LUMPECTOMY         Indications: This is a 46 year old female with calcifications and a radial scar of left breast. The decision is then made to proceed with needle localized left breast lumpectomy  Procedure: The patient had a localization wire placed in the left breast by radiology.she was then taken to the operating room. She was identified as the correct patient placed in the supine position on the operating table. Gen. Anesthesia was then induced. Her left breast was prepped and draped in the usual sterile fashion. Either side the skin around the localization wire with Marcaine. An elliptical incision around the localization wire into the status of the breast tissue with the electrocautery. I then performed a wide lumpectomy going down to the chest wall with electrocautery incorporating all tissue around the localization wire. A lobectomy specimen was then completely removed. X-ray confirmed a suspicious area and previously placed clip were in the specimen. The specimens and sent to pathology for evaluation. Hemostasis was achieved with cautery. I anesthetized the wound for marking. I then closed the subcutaneous tissue with interrupted 3-0 Vicryl sutures and closed the skin with a running 4-0 Monocryl. Steri-Strips, gauze, and Tegaderm was applied. The patient tolerated the procedure well. All the counts were correct at the end of the procedure. The patient was then activated the operating room and taken in stable  condition to the recovery room. Hitomi Slape A   Date: 03/21/2012  Time: 12:49 PM

## 2012-03-21 NOTE — Anesthesia Preprocedure Evaluation (Addendum)
Anesthesia Evaluation  Patient identified by MRN, date of birth, ID band Patient awake    Reviewed: Allergy & Precautions, H&P , NPO status , Patient's Chart, lab work & pertinent test results, reviewed documented beta blocker date and time   Airway Mallampati: II  Neck ROM: full    Dental  (+) Teeth Intact and Dental Advisory Given   Pulmonary asthma , Recent URI ,          Cardiovascular hypertension, Pt. on medications     Neuro/Psych  Headaches,    GI/Hepatic   Endo/Other    Renal/GU      Musculoskeletal   Abdominal   Peds  Hematology   Anesthesia Other Findings   Reproductive/Obstetrics                          Anesthesia Physical Anesthesia Plan  ASA: II  Anesthesia Plan: MAC   Post-op Pain Management:    Induction: Intravenous  Airway Management Planned: Nasal Cannula  Additional Equipment:   Intra-op Plan:   Post-operative Plan:   Informed Consent: I have reviewed the patients History and Physical, chart, labs and discussed the procedure including the risks, benefits and alternatives for the proposed anesthesia with the patient or authorized representative who has indicated his/her understanding and acceptance.   Dental advisory given  Plan Discussed with: CRNA and Surgeon  Anesthesia Plan Comments:        Anesthesia Quick Evaluation

## 2012-03-21 NOTE — Anesthesia Procedure Notes (Signed)
Procedure Name: LMA Insertion Date/Time: 03/21/2012 12:14 PM Performed by: Garen Lah Pre-anesthesia Checklist: Patient identified, Timeout performed, Emergency Drugs available, Suction available and Patient being monitored Patient Re-evaluated:Patient Re-evaluated prior to inductionOxygen Delivery Method: Circle system utilized Preoxygenation: Pre-oxygenation with 100% oxygen Intubation Type: IV induction LMA: LMA inserted LMA Size: 4.0 Number of attempts: 1 Placement Confirmation: positive ETCO2 and breath sounds checked- equal and bilateral Tube secured with: Tape Dental Injury: Teeth and Oropharynx as per pre-operative assessment

## 2012-03-31 ENCOUNTER — Ambulatory Visit (INDEPENDENT_AMBULATORY_CARE_PROVIDER_SITE_OTHER): Payer: 59 | Admitting: Surgery

## 2012-03-31 ENCOUNTER — Encounter (INDEPENDENT_AMBULATORY_CARE_PROVIDER_SITE_OTHER): Payer: Self-pay | Admitting: Surgery

## 2012-03-31 VITALS — BP 140/100 | HR 98 | Temp 97.6°F | Resp 16 | Ht 66.0 in | Wt 231.2 lb

## 2012-03-31 DIAGNOSIS — Z09 Encounter for follow-up examination after completed treatment for conditions other than malignant neoplasm: Secondary | ICD-10-CM

## 2012-03-31 NOTE — Progress Notes (Signed)
Subjective:     Patient ID: [name], female   DOB: [birth year], 46 y.o.   MRN: [id number]  HPI She is here for her first postop visit status post needle localized right breast lumpectomy for calcifications and a radial scar. She is doing well and has no complaints  Review of Systems     Objective:   Physical Exam On exam, her incision is healing well with some mild bruising. The final pathology showed no evidence of malignancy    Assessment:     Patient doing well status post left breast needle localized lumpectomy    Plan:     I will see her as needed. She will continue self examinations and yearly mammograms

## 2012-04-01 ENCOUNTER — Encounter (INDEPENDENT_AMBULATORY_CARE_PROVIDER_SITE_OTHER): Payer: Self-pay

## 2012-05-01 ENCOUNTER — Encounter (INDEPENDENT_AMBULATORY_CARE_PROVIDER_SITE_OTHER): Payer: Self-pay

## 2013-11-29 LAB — HIV ANTIBODY (ROUTINE TESTING W REFLEX): HIV 1&2 Ab, 4th Generation: NEGATIVE

## 2016-10-23 DIAGNOSIS — M9901 Segmental and somatic dysfunction of cervical region: Secondary | ICD-10-CM | POA: Diagnosis not present

## 2016-10-23 DIAGNOSIS — M5033 Other cervical disc degeneration, cervicothoracic region: Secondary | ICD-10-CM | POA: Diagnosis not present

## 2016-10-24 DIAGNOSIS — M9901 Segmental and somatic dysfunction of cervical region: Secondary | ICD-10-CM | POA: Diagnosis not present

## 2016-10-24 DIAGNOSIS — M5033 Other cervical disc degeneration, cervicothoracic region: Secondary | ICD-10-CM | POA: Diagnosis not present

## 2016-10-25 DIAGNOSIS — M9901 Segmental and somatic dysfunction of cervical region: Secondary | ICD-10-CM | POA: Diagnosis not present

## 2016-10-25 DIAGNOSIS — M5033 Other cervical disc degeneration, cervicothoracic region: Secondary | ICD-10-CM | POA: Diagnosis not present

## 2016-10-29 DIAGNOSIS — M9901 Segmental and somatic dysfunction of cervical region: Secondary | ICD-10-CM | POA: Diagnosis not present

## 2016-10-29 DIAGNOSIS — M5033 Other cervical disc degeneration, cervicothoracic region: Secondary | ICD-10-CM | POA: Diagnosis not present

## 2016-11-02 DIAGNOSIS — Z01419 Encounter for gynecological examination (general) (routine) without abnormal findings: Secondary | ICD-10-CM | POA: Diagnosis not present

## 2016-11-06 DIAGNOSIS — I10 Essential (primary) hypertension: Secondary | ICD-10-CM | POA: Diagnosis not present

## 2016-11-06 DIAGNOSIS — J329 Chronic sinusitis, unspecified: Secondary | ICD-10-CM | POA: Diagnosis not present

## 2016-11-06 DIAGNOSIS — J45901 Unspecified asthma with (acute) exacerbation: Secondary | ICD-10-CM | POA: Diagnosis not present

## 2016-11-07 DIAGNOSIS — M5033 Other cervical disc degeneration, cervicothoracic region: Secondary | ICD-10-CM | POA: Diagnosis not present

## 2016-11-07 DIAGNOSIS — Z1211 Encounter for screening for malignant neoplasm of colon: Secondary | ICD-10-CM | POA: Diagnosis not present

## 2016-11-07 DIAGNOSIS — M9901 Segmental and somatic dysfunction of cervical region: Secondary | ICD-10-CM | POA: Diagnosis not present

## 2016-11-20 DIAGNOSIS — M9901 Segmental and somatic dysfunction of cervical region: Secondary | ICD-10-CM | POA: Diagnosis not present

## 2016-11-20 DIAGNOSIS — M5033 Other cervical disc degeneration, cervicothoracic region: Secondary | ICD-10-CM | POA: Diagnosis not present

## 2016-11-21 DIAGNOSIS — M9901 Segmental and somatic dysfunction of cervical region: Secondary | ICD-10-CM | POA: Diagnosis not present

## 2016-11-21 DIAGNOSIS — M5033 Other cervical disc degeneration, cervicothoracic region: Secondary | ICD-10-CM | POA: Diagnosis not present

## 2016-11-26 DIAGNOSIS — M5033 Other cervical disc degeneration, cervicothoracic region: Secondary | ICD-10-CM | POA: Diagnosis not present

## 2016-11-26 DIAGNOSIS — M9901 Segmental and somatic dysfunction of cervical region: Secondary | ICD-10-CM | POA: Diagnosis not present

## 2016-11-28 DIAGNOSIS — M5033 Other cervical disc degeneration, cervicothoracic region: Secondary | ICD-10-CM | POA: Diagnosis not present

## 2016-11-28 DIAGNOSIS — M9901 Segmental and somatic dysfunction of cervical region: Secondary | ICD-10-CM | POA: Diagnosis not present

## 2016-12-03 DIAGNOSIS — M5033 Other cervical disc degeneration, cervicothoracic region: Secondary | ICD-10-CM | POA: Diagnosis not present

## 2016-12-03 DIAGNOSIS — M9901 Segmental and somatic dysfunction of cervical region: Secondary | ICD-10-CM | POA: Diagnosis not present

## 2016-12-05 DIAGNOSIS — M5033 Other cervical disc degeneration, cervicothoracic region: Secondary | ICD-10-CM | POA: Diagnosis not present

## 2016-12-05 DIAGNOSIS — M9901 Segmental and somatic dysfunction of cervical region: Secondary | ICD-10-CM | POA: Diagnosis not present

## 2016-12-06 DIAGNOSIS — M9901 Segmental and somatic dysfunction of cervical region: Secondary | ICD-10-CM | POA: Diagnosis not present

## 2016-12-06 DIAGNOSIS — M5033 Other cervical disc degeneration, cervicothoracic region: Secondary | ICD-10-CM | POA: Diagnosis not present

## 2016-12-11 DIAGNOSIS — M9901 Segmental and somatic dysfunction of cervical region: Secondary | ICD-10-CM | POA: Diagnosis not present

## 2016-12-11 DIAGNOSIS — M5033 Other cervical disc degeneration, cervicothoracic region: Secondary | ICD-10-CM | POA: Diagnosis not present

## 2016-12-13 DIAGNOSIS — M9901 Segmental and somatic dysfunction of cervical region: Secondary | ICD-10-CM | POA: Diagnosis not present

## 2016-12-13 DIAGNOSIS — M5033 Other cervical disc degeneration, cervicothoracic region: Secondary | ICD-10-CM | POA: Diagnosis not present

## 2016-12-17 DIAGNOSIS — M9901 Segmental and somatic dysfunction of cervical region: Secondary | ICD-10-CM | POA: Diagnosis not present

## 2016-12-17 DIAGNOSIS — M5033 Other cervical disc degeneration, cervicothoracic region: Secondary | ICD-10-CM | POA: Diagnosis not present

## 2016-12-19 DIAGNOSIS — M5033 Other cervical disc degeneration, cervicothoracic region: Secondary | ICD-10-CM | POA: Diagnosis not present

## 2016-12-19 DIAGNOSIS — M9901 Segmental and somatic dysfunction of cervical region: Secondary | ICD-10-CM | POA: Diagnosis not present

## 2016-12-25 DIAGNOSIS — I10 Essential (primary) hypertension: Secondary | ICD-10-CM | POA: Diagnosis not present

## 2016-12-25 DIAGNOSIS — G43609 Persistent migraine aura with cerebral infarction, not intractable, without status migrainosus: Secondary | ICD-10-CM | POA: Diagnosis not present

## 2016-12-25 DIAGNOSIS — J45901 Unspecified asthma with (acute) exacerbation: Secondary | ICD-10-CM | POA: Diagnosis not present

## 2016-12-31 DIAGNOSIS — M9901 Segmental and somatic dysfunction of cervical region: Secondary | ICD-10-CM | POA: Diagnosis not present

## 2016-12-31 DIAGNOSIS — M5033 Other cervical disc degeneration, cervicothoracic region: Secondary | ICD-10-CM | POA: Diagnosis not present

## 2017-01-02 DIAGNOSIS — M9901 Segmental and somatic dysfunction of cervical region: Secondary | ICD-10-CM | POA: Diagnosis not present

## 2017-01-02 DIAGNOSIS — M5033 Other cervical disc degeneration, cervicothoracic region: Secondary | ICD-10-CM | POA: Diagnosis not present

## 2017-01-03 DIAGNOSIS — M9901 Segmental and somatic dysfunction of cervical region: Secondary | ICD-10-CM | POA: Diagnosis not present

## 2017-01-03 DIAGNOSIS — M5033 Other cervical disc degeneration, cervicothoracic region: Secondary | ICD-10-CM | POA: Diagnosis not present

## 2017-01-08 DIAGNOSIS — M9901 Segmental and somatic dysfunction of cervical region: Secondary | ICD-10-CM | POA: Diagnosis not present

## 2017-01-08 DIAGNOSIS — M5033 Other cervical disc degeneration, cervicothoracic region: Secondary | ICD-10-CM | POA: Diagnosis not present

## 2017-02-22 DIAGNOSIS — Z1231 Encounter for screening mammogram for malignant neoplasm of breast: Secondary | ICD-10-CM | POA: Diagnosis not present

## 2017-02-22 DIAGNOSIS — Z803 Family history of malignant neoplasm of breast: Secondary | ICD-10-CM | POA: Diagnosis not present

## 2017-04-03 DIAGNOSIS — J45901 Unspecified asthma with (acute) exacerbation: Secondary | ICD-10-CM | POA: Diagnosis not present

## 2017-04-03 DIAGNOSIS — I10 Essential (primary) hypertension: Secondary | ICD-10-CM | POA: Diagnosis not present

## 2017-04-04 DIAGNOSIS — M9901 Segmental and somatic dysfunction of cervical region: Secondary | ICD-10-CM | POA: Diagnosis not present

## 2017-04-04 DIAGNOSIS — M5033 Other cervical disc degeneration, cervicothoracic region: Secondary | ICD-10-CM | POA: Diagnosis not present

## 2017-04-05 DIAGNOSIS — G43909 Migraine, unspecified, not intractable, without status migrainosus: Secondary | ICD-10-CM | POA: Diagnosis not present

## 2017-04-05 DIAGNOSIS — R739 Hyperglycemia, unspecified: Secondary | ICD-10-CM | POA: Diagnosis not present

## 2017-04-05 DIAGNOSIS — I10 Essential (primary) hypertension: Secondary | ICD-10-CM | POA: Diagnosis not present

## 2017-05-07 DIAGNOSIS — I10 Essential (primary) hypertension: Secondary | ICD-10-CM | POA: Diagnosis not present

## 2017-05-07 DIAGNOSIS — J45901 Unspecified asthma with (acute) exacerbation: Secondary | ICD-10-CM | POA: Diagnosis not present

## 2017-07-22 DIAGNOSIS — J45909 Unspecified asthma, uncomplicated: Secondary | ICD-10-CM | POA: Diagnosis not present

## 2017-07-22 DIAGNOSIS — I10 Essential (primary) hypertension: Secondary | ICD-10-CM | POA: Diagnosis not present

## 2017-07-25 DIAGNOSIS — E785 Hyperlipidemia, unspecified: Secondary | ICD-10-CM | POA: Diagnosis not present

## 2017-07-25 DIAGNOSIS — I10 Essential (primary) hypertension: Secondary | ICD-10-CM | POA: Diagnosis not present

## 2017-07-25 DIAGNOSIS — E1165 Type 2 diabetes mellitus with hyperglycemia: Secondary | ICD-10-CM | POA: Diagnosis not present

## 2017-07-25 DIAGNOSIS — Z23 Encounter for immunization: Secondary | ICD-10-CM | POA: Diagnosis not present

## 2017-08-15 DIAGNOSIS — M5033 Other cervical disc degeneration, cervicothoracic region: Secondary | ICD-10-CM | POA: Diagnosis not present

## 2017-08-15 DIAGNOSIS — M9901 Segmental and somatic dysfunction of cervical region: Secondary | ICD-10-CM | POA: Diagnosis not present

## 2017-08-29 DIAGNOSIS — M9901 Segmental and somatic dysfunction of cervical region: Secondary | ICD-10-CM | POA: Diagnosis not present

## 2017-08-29 DIAGNOSIS — M5033 Other cervical disc degeneration, cervicothoracic region: Secondary | ICD-10-CM | POA: Diagnosis not present

## 2017-09-02 DIAGNOSIS — M9901 Segmental and somatic dysfunction of cervical region: Secondary | ICD-10-CM | POA: Diagnosis not present

## 2017-09-02 DIAGNOSIS — M5033 Other cervical disc degeneration, cervicothoracic region: Secondary | ICD-10-CM | POA: Diagnosis not present

## 2017-10-28 DIAGNOSIS — G43909 Migraine, unspecified, not intractable, without status migrainosus: Secondary | ICD-10-CM | POA: Diagnosis not present

## 2017-10-28 DIAGNOSIS — I10 Essential (primary) hypertension: Secondary | ICD-10-CM | POA: Diagnosis not present

## 2017-10-28 DIAGNOSIS — E119 Type 2 diabetes mellitus without complications: Secondary | ICD-10-CM | POA: Diagnosis not present

## 2017-11-08 DIAGNOSIS — Z01419 Encounter for gynecological examination (general) (routine) without abnormal findings: Secondary | ICD-10-CM | POA: Diagnosis not present

## 2017-11-08 DIAGNOSIS — N951 Menopausal and female climacteric states: Secondary | ICD-10-CM | POA: Diagnosis not present

## 2017-11-18 DIAGNOSIS — Z1211 Encounter for screening for malignant neoplasm of colon: Secondary | ICD-10-CM | POA: Diagnosis not present

## 2018-01-20 DIAGNOSIS — E119 Type 2 diabetes mellitus without complications: Secondary | ICD-10-CM | POA: Diagnosis not present

## 2018-01-20 DIAGNOSIS — J45909 Unspecified asthma, uncomplicated: Secondary | ICD-10-CM | POA: Diagnosis not present

## 2018-01-20 DIAGNOSIS — I10 Essential (primary) hypertension: Secondary | ICD-10-CM | POA: Diagnosis not present

## 2018-01-22 DIAGNOSIS — J45909 Unspecified asthma, uncomplicated: Secondary | ICD-10-CM | POA: Diagnosis not present

## 2018-01-22 DIAGNOSIS — I10 Essential (primary) hypertension: Secondary | ICD-10-CM | POA: Diagnosis not present

## 2018-01-22 DIAGNOSIS — E1169 Type 2 diabetes mellitus with other specified complication: Secondary | ICD-10-CM | POA: Diagnosis not present

## 2018-02-28 DIAGNOSIS — Z1231 Encounter for screening mammogram for malignant neoplasm of breast: Secondary | ICD-10-CM | POA: Diagnosis not present

## 2018-02-28 DIAGNOSIS — Z803 Family history of malignant neoplasm of breast: Secondary | ICD-10-CM | POA: Diagnosis not present

## 2018-07-24 DIAGNOSIS — J45909 Unspecified asthma, uncomplicated: Secondary | ICD-10-CM | POA: Diagnosis not present

## 2018-07-24 DIAGNOSIS — I10 Essential (primary) hypertension: Secondary | ICD-10-CM | POA: Diagnosis not present

## 2018-07-24 DIAGNOSIS — E119 Type 2 diabetes mellitus without complications: Secondary | ICD-10-CM | POA: Diagnosis not present

## 2018-07-24 DIAGNOSIS — Z23 Encounter for immunization: Secondary | ICD-10-CM | POA: Diagnosis not present

## 2018-10-24 DIAGNOSIS — I10 Essential (primary) hypertension: Secondary | ICD-10-CM | POA: Diagnosis not present

## 2018-10-24 DIAGNOSIS — E1165 Type 2 diabetes mellitus with hyperglycemia: Secondary | ICD-10-CM | POA: Diagnosis not present

## 2018-10-24 DIAGNOSIS — E1169 Type 2 diabetes mellitus with other specified complication: Secondary | ICD-10-CM | POA: Diagnosis not present

## 2018-10-27 DIAGNOSIS — Z23 Encounter for immunization: Secondary | ICD-10-CM | POA: Diagnosis not present

## 2018-10-27 DIAGNOSIS — I10 Essential (primary) hypertension: Secondary | ICD-10-CM | POA: Diagnosis not present

## 2018-10-27 DIAGNOSIS — J45909 Unspecified asthma, uncomplicated: Secondary | ICD-10-CM | POA: Diagnosis not present

## 2018-10-27 DIAGNOSIS — E1165 Type 2 diabetes mellitus with hyperglycemia: Secondary | ICD-10-CM | POA: Diagnosis not present

## 2018-11-21 DIAGNOSIS — Z01419 Encounter for gynecological examination (general) (routine) without abnormal findings: Secondary | ICD-10-CM | POA: Diagnosis not present

## 2018-11-21 DIAGNOSIS — Z130 Encounter for screening for diseases of the blood and blood-forming organs and certain disorders involving the immune mechanism: Secondary | ICD-10-CM | POA: Diagnosis not present

## 2018-11-21 DIAGNOSIS — Z1389 Encounter for screening for other disorder: Secondary | ICD-10-CM | POA: Diagnosis not present

## 2018-12-03 DIAGNOSIS — Z1211 Encounter for screening for malignant neoplasm of colon: Secondary | ICD-10-CM | POA: Diagnosis not present

## 2018-12-25 ENCOUNTER — Encounter: Payer: Self-pay | Admitting: Pulmonary Disease

## 2018-12-25 DIAGNOSIS — N390 Urinary tract infection, site not specified: Secondary | ICD-10-CM | POA: Diagnosis not present

## 2019-01-22 DIAGNOSIS — G43909 Migraine, unspecified, not intractable, without status migrainosus: Secondary | ICD-10-CM | POA: Diagnosis not present

## 2019-01-22 DIAGNOSIS — E1169 Type 2 diabetes mellitus with other specified complication: Secondary | ICD-10-CM | POA: Diagnosis not present

## 2019-01-22 DIAGNOSIS — I10 Essential (primary) hypertension: Secondary | ICD-10-CM | POA: Diagnosis not present

## 2019-02-19 DIAGNOSIS — I10 Essential (primary) hypertension: Secondary | ICD-10-CM | POA: Diagnosis not present

## 2019-02-19 DIAGNOSIS — E1165 Type 2 diabetes mellitus with hyperglycemia: Secondary | ICD-10-CM | POA: Diagnosis not present

## 2019-02-19 DIAGNOSIS — J45909 Unspecified asthma, uncomplicated: Secondary | ICD-10-CM | POA: Diagnosis not present

## 2019-03-13 LAB — HM MAMMOGRAPHY

## 2019-04-04 LAB — HM MAMMOGRAPHY

## 2019-04-25 LAB — BASIC METABOLIC PANEL
BUN: 20 (ref 4–21)
CO2: 28 — AB (ref 13–22)
Chloride: 98 — AB (ref 99–108)
Creatinine: 0.7 (ref <=1.1)
Glucose: 163
Potassium: 4.7 (ref 3.4–5.3)
Sodium: 140 (ref 137–147)

## 2019-04-25 LAB — CBC AND DIFFERENTIAL
HCT: 43 (ref 36–46)
Hemoglobin: 13.9 (ref 12.0–16.0)
Neutrophils Absolute: 4
Platelets: 236 (ref 150–399)
WBC: 6.5

## 2019-04-25 LAB — COMPREHENSIVE METABOLIC PANEL
Albumin: 4.6 (ref 3.5–5.0)
Calcium: 10 (ref 8.7–10.7)
GFR calc Af Amer: 105
GFR calc non Af Amer: 91
Globulin: 2.4

## 2019-04-25 LAB — TSH: TSH: 1.93 (ref <=5.90)

## 2019-04-25 LAB — CBC: RBC: 5.17 — AB (ref 3.87–5.11)

## 2019-07-02 ENCOUNTER — Other Ambulatory Visit: Payer: Self-pay | Admitting: *Deleted

## 2019-07-02 DIAGNOSIS — Z20822 Contact with and (suspected) exposure to covid-19: Secondary | ICD-10-CM

## 2019-07-04 LAB — NOVEL CORONAVIRUS, NAA: SARS-CoV-2, NAA: NOT DETECTED

## 2019-08-08 LAB — HEMOGLOBIN A1C: Hemoglobin A1C: 7.9

## 2019-08-08 LAB — LIPID PANEL
Cholesterol: 144 (ref 0–200)
HDL: 44 (ref 35–70)
LDL Cholesterol: 74
Triglycerides: 148 (ref 40–160)

## 2019-08-08 LAB — HEPATIC FUNCTION PANEL
ALT: 21 (ref 7–35)
AST: 24 (ref 13–35)
Alkaline Phosphatase: 76 (ref 25–125)
Bilirubin, Total: 0.6

## 2019-11-12 ENCOUNTER — Ambulatory Visit: Payer: 59 | Admitting: Family Medicine

## 2019-11-12 ENCOUNTER — Encounter: Payer: Self-pay | Admitting: Family Medicine

## 2019-11-12 ENCOUNTER — Other Ambulatory Visit: Payer: Self-pay

## 2019-11-12 VITALS — BP 128/90 | HR 93 | Temp 98.1°F | Ht 66.0 in | Wt 246.0 lb

## 2019-11-12 DIAGNOSIS — E119 Type 2 diabetes mellitus without complications: Secondary | ICD-10-CM

## 2019-11-12 DIAGNOSIS — G43809 Other migraine, not intractable, without status migrainosus: Secondary | ICD-10-CM | POA: Diagnosis not present

## 2019-11-12 DIAGNOSIS — I10 Essential (primary) hypertension: Secondary | ICD-10-CM

## 2019-11-12 DIAGNOSIS — F32A Depression, unspecified: Secondary | ICD-10-CM

## 2019-11-12 DIAGNOSIS — E785 Hyperlipidemia, unspecified: Secondary | ICD-10-CM | POA: Diagnosis not present

## 2019-11-12 DIAGNOSIS — F329 Major depressive disorder, single episode, unspecified: Secondary | ICD-10-CM

## 2019-11-12 DIAGNOSIS — G43909 Migraine, unspecified, not intractable, without status migrainosus: Secondary | ICD-10-CM | POA: Insufficient documentation

## 2019-11-12 HISTORY — DX: Depression, unspecified: F32.A

## 2019-11-12 MED ORDER — METFORMIN HCL 500 MG PO TABS: 1000.0000 mg | ORAL_TABLET | Freq: Two times a day (BID) | ORAL | 1 refills | Status: DC

## 2019-11-12 MED ORDER — GLIMEPIRIDE 2 MG PO TABS: 2.0000 mg | ORAL_TABLET | Freq: Every day | ORAL | 3 refills | Status: DC

## 2019-11-12 NOTE — Progress Notes (Signed)
New Patient Office Visit  Subjective:  Patient ID: [name], female    DOB: September 21, 1966  Age: 54 y.o. MRN: [id number]  CC:  Chief Complaint  Patient presents with  . Establish Care    HPI [name] presents for Asthma-albuterol-no recent use-trigger with fall allergies, dust, cold air HTN -pt takes bp at home-125/75-takes Losartan 50mg  , HCTZ 12.5mg  -daily DM-metformin 2 BID , pt was not able to afford Januvia-insurance did not cover-appt next month with eye doctor- no prior education Hyperlipidemia-takes lipitor 10mg -started 6 months ago-pt states repeat labwork showed stable control AR-claritin Headaches-migraines-sumatriptan 100mg  -episodically used within the past few months-no headaches since not working GYN appt -hysterectomy-ovaries remain Past Medical History:  Diagnosis Date  . Allergy   . Asthma   . Diabetes mellitus without complication ([place])   . Headache(784.0)    migraines occ  . Hypertension   . Recurrent upper respiratory infection (URI)    z pak finished 03/11/12    Past Surgical History:  Procedure Laterality Date  . ABDOMINAL HYSTERECTOMY    . BREAST SURGERY    . DILATION AND CURETTAGE OF UTERUS    . NASAL SINUS SURGERY     deviated septum    Family History  Problem Relation Age of Onset  . Cancer Mother   . 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 Marital status: Married    Spouse name: Not on file  . Number of children: Not on file  . Years of education: Not on file  . Highest education level: Not on file  Occupational History  . Not on file  Tobacco Use  . Smoking status: Never Smoker  . Smokeless tobacco: Never Used  Substance and Sexual Activity  . Alcohol use: No  . Drug use: No  . Sexual activity: Yes    Comment: hysterectomy  Other Topics Concern  . Not on file  Social History Narrative  . Not on file    Social Determinants of Health   Financial Resource Strain:   . Difficulty of Paying Living Expenses: Not on file  Food Insecurity:   . Worried About Charity fundraiser in the Last Year: Not on file  . Ran Out of Food in the Last Year: Not on file  Transportation Needs:   . Lack of Transportation (Medical): Not on file  . Lack of Transportation (Non-Medical): Not on file  Physical Activity:   . Days of Exercise per Week: Not on file  . Minutes of Exercise per Session: Not on file  Stress:   . Feeling of Stress : Not on file  Social Connections:   . Frequency of Communication with Friends and Family: Not on file  . Frequency of Social Gatherings with Friends and Family: Not on file  . Attends Religious Services: Not on file  . Active Member of Clubs or Organizations: Not on file  . Attends Archivist Meetings: Not on file  . Marital Status: Not on file  Intimate Partner Violence:   . Fear of Current or Ex-Partner: Not on file  . Emotionally Abused: Not on file  . Physically Abused: Not on file  . Sexually Abused: Not on file    ROS Review of Systems  Constitutional: Negative.   HENT: Positive for postnasal drip and rhinorrhea.   Eyes:       Glasses-appt next month  Respiratory:  Athma  Cardiovascular: Negative.   Gastrointestinal: Negative.   Endocrine: Negative.   Genitourinary: Negative.   Musculoskeletal: Negative.   Skin: Negative.   Allergic/Immunologic: Positive for environmental allergies.       Claritin-allergy trigger for asthma  Neurological: Positive for headaches.  Hematological: Negative.   Psychiatric/Behavioral: Positive for dysphoric mood.       Stable depression after mother and brother died    Objective:   Today's Vitals: BP 128/90 (BP Location: Left Arm, Patient Position: Sitting, Cuff Size: Normal)   Pulse 93   Temp 98.1 F (36.7 C) (Temporal)   Ht 5\' 6"  (1.676 m)   Wt 246 lb (111.6 kg)   SpO2 93%   BMI 39.71 kg/m    Physical Exam Constitutional:      Appearance: Normal appearance.  HENT:     Head: Normocephalic and atraumatic.  Cardiovascular:     Rate and Rhythm: Normal rate and regular rhythm.     Pulses: Normal pulses.     Heart sounds: Normal heart sounds.  Pulmonary:     Effort: Pulmonary effort is normal.     Breath sounds: Normal breath sounds.  Musculoskeletal:        General: Normal range of motion.     Cervical back: Normal range of motion and neck supple.  Neurological:     Mental Status: She is alert and oriented to person, place, and time.  Psychiatric:        Mood and Affect: Mood normal.        Behavior: Behavior normal.     Assessment & Plan:  1. Diabetes mellitus without complication (HCC) Metformin, start amaryl 2mg -elevated A1c 3 months ago - Amb ref to Medical Nutrition Therapy-MNT - Hemoglobin A1c - COMPLETE METABOLIC PANEL WITH GFR - Urinalysis  2. Essential hypertension Losartan/HCTZ-cmp, u/a  3. Hyperlipidemia, unspecified hyperlipidemia type atorvastatin-stable  4. Other migraine without status migrainosus, not intractable Sumatriptan-prn  5. Depression, unspecified depression type esctalopram-stable  Outpatient Encounter Medications as of 11/12/2019  Medication Sig  . albuterol (PROVENTIL HFA;VENTOLIN HFA) 108 (90 BASE) MCG/ACT inhaler Inhale 1 puff into the lungs every 6 (six) hours as needed. As needed for asthma.  Marland Kitchen atorvastatin (LIPITOR) 10 MG tablet Take 10 mg by mouth daily.  . cetirizine (ZYRTEC) 10 MG tablet Take 10 mg by mouth daily.  Marland Kitchen ESCITALOPRAM OXALATE PO Take 10 mg by mouth daily.  . metFORMIN (GLUCOPHAGE) 500 MG tablet Take 500 mg by mouth 2 (two) times daily with a meal.  . Omega-3 Fatty Acids (FISH OIL) 1200 MG CAPS Take 1 capsule by mouth daily.  Marland Kitchen OVER THE COUNTER MEDICATION Take 2 tablets by mouth daily. Flinstone Gummy Multivitamin.  . SUMAtriptan (IMITREX) 100 MG tablet Take 100 mg by mouth every 2 (two) hours as needed.  .  [DISCONTINUED] HYDROcodone-homatropine (HYCODAN) 5-1.5 MG/5ML syrup   . [DISCONTINUED] losartan (COZAAR) 100 MG tablet Take 100 mg by mouth daily.   No facility-administered encounter medications on file as of 11/12/2019.    Follow-up: 3 months  Maxson Oddo Hannah Beat, MD

## 2019-11-12 NOTE — Patient Instructions (Addendum)
Consider completing a colonoscopy  labwork prior to follow appointment  Glucose -continue to check glucose fasting and 2 hours after largest meal-bring readings to the next appointment  See diabetic education prior to next appointment  COVID-19 Vaccine Information can be found at: [email or web address].uk For questions related to vaccine distribution or appointments, please email [email or web address] or call [phone].

## 2019-12-15 ENCOUNTER — Encounter: Payer: Self-pay | Admitting: Nutrition

## 2019-12-15 ENCOUNTER — Other Ambulatory Visit: Payer: Self-pay

## 2019-12-15 ENCOUNTER — Encounter: Payer: 59 | Attending: Family Medicine | Admitting: Nutrition

## 2019-12-15 DIAGNOSIS — E782 Mixed hyperlipidemia: Secondary | ICD-10-CM

## 2019-12-15 DIAGNOSIS — E119 Type 2 diabetes mellitus without complications: Secondary | ICD-10-CM

## 2019-12-15 DIAGNOSIS — I10 Essential (primary) hypertension: Secondary | ICD-10-CM

## 2019-12-15 NOTE — Progress Notes (Signed)
  Medical Nutrition Therapy:  Appt start time: 1430 end time:  [id number].   Assessment:  Primary concerns today: Diabetes Type 2, Obesity. Lives with her husband. Cooks and shops. Eats 2 meals per day. DX a year ago. A1C 7.9%. Sees Dr. Holly Bodily. Testing twice a day. FBS 138-140's Bedtime  90-120's Metformin 1000 mg BID. Glimepiride 2 mg a day before breakfast Willing to work on eating habits to improve her DM and lose weight. Lab Results  Component Value Date   HGBA1C 7.9 08/08/2019   CMP Latest Ref Rng & Units 08/08/2019 04/25/2019 03/14/2012  Glucose 70 - 99 mg/dL - - 132(H)  BUN 4 - 21 - 20 20  Creatinine 0.5 - 1.1 - 0.7 0.71  Sodium 137 - 147 - 140 138  Potassium 3.4 - 5.3 - 4.7 4.4  Chloride 99 - 108 - 98(A) 97  CO2 13 - 22 - 28(A) 32  Calcium 8.7 - 10.7 - 10.0 10.3  Alkaline Phos 25 - 125 76 - -  AST 13 - 35 24 - -  ALT 7 - 35 21 - -      Preferred Learning Style:    Visual  Learning Readiness:   Ready  Change in progress   MEDICATIONS:    DIETARY INTAKE:  24-hr recall:  B ( AM): skips Snk ( AM):   L ( PM): Grilled tenderloin, beets and okra, 1/2 and 1/2 tea Snk ( PM): apple or fruit D ( PM): CHicken grilled sandwich with french fries, 1/2 and 1/2 tea Snk ( PM):  Beverages: 1/2 and 1/2 tea,   Usual physical activity: Walks a dog.  Estimated energy needs: 1200  calories 135 g carbohydrates 90 g protein 33 g fat  Progress Towards Goal(s):  In progress.   Nutritional Diagnosis:  NB-1.1 Food and nutrition-related knowledge deficit As related to Diabetes Type 2.  As evidenced by A1C 7.9%..    Intervention:  Nutrition and Diabetes education provided on My Plate, CHO counting, meal planning, portion sizes, timing of meals, avoiding snacks between meals unless having a low blood sugar, target ranges for A1C and blood sugars, signs/symptoms and treatment of hyper/hypoglycemia, monitoring blood sugars, taking medications as prescribed, benefits of exercising 30  minutes per day and prevention of complications of DM.   Goals Eat meals on time Don't skip meals Increase fresh fruits and vegetables Eat 2-3 carb choices per meal Take Metormin after breakfast and after dinner Drink only water Get A1C down to 7% Increase exercise as tolerated Lose 2-3 lbs per month   Teaching Method Utilized:   Visual Auditory Hands on  Handouts given during visit include:  The Plate Method   Meal Plan Card  Diabetes Instructions. none  Barriers to learning/adherence to lifestyle change:   Demonstrated degree of understanding via:  Teach Back   Monitoring/Evaluation:  Dietary intake, exercise, , and body weight in 1 month(s). >>>Recommend to consider changing Glimiperide to Glipizide for greater effectiveness in helping reduce blood sugar.>>>

## 2019-12-15 NOTE — Patient Instructions (Signed)
   Goals Eat meals on time Don't skip meals Increase fresh fruits and vegetables Eat 2-3 carb choices per meal Take Metormin after breakfast and after dinner Drink only water Get A1C down to 7% Increase exercise as tolerated Lose 2-3 lbs per month

## 2019-12-16 ENCOUNTER — Encounter: Payer: Self-pay | Admitting: Nutrition

## 2020-01-25 ENCOUNTER — Other Ambulatory Visit: Payer: Self-pay | Admitting: Family Medicine

## 2020-02-08 ENCOUNTER — Other Ambulatory Visit: Payer: Self-pay | Admitting: Family Medicine

## 2020-02-09 LAB — COMPREHENSIVE METABOLIC PANEL
ALT: 16 IU/L (ref 0–32)
AST: 18 IU/L (ref 0–40)
Albumin/Globulin Ratio: 1.8 (ref 1.2–2.2)
Albumin: 4.5 g/dL (ref 3.8–4.9)
Alkaline Phosphatase: 87 IU/L (ref 39–117)
BUN/Creatinine Ratio: 25 — ABNORMAL HIGH (ref 9–23)
BUN: 19 mg/dL (ref 6–24)
Bilirubin Total: 0.4 mg/dL (ref 0.0–1.2)
CO2: 30 mmol/L — ABNORMAL HIGH (ref 20–29)
Calcium: 9.5 mg/dL (ref 8.7–10.2)
Chloride: 101 mmol/L (ref 96–106)
Creatinine, Ser: 0.75 mg/dL (ref 0.57–1.00)
GFR calc Af Amer: 103 mL/min/{1.73_m2} (ref >59)
GFR calc non Af Amer: 89 mL/min/{1.73_m2} (ref >59)
Globulin, Total: 2.5 g/dL (ref 1.5–4.5)
Glucose: 226 mg/dL — ABNORMAL HIGH (ref 65–99)
Potassium: 4.3 mmol/L (ref 3.5–5.2)
Sodium: 141 mmol/L (ref 134–144)
Total Protein: 7 g/dL (ref 6.0–8.5)

## 2020-02-09 LAB — URINALYSIS, ROUTINE W REFLEX MICROSCOPIC
Bilirubin, UA: NEGATIVE
Glucose, UA: NEGATIVE
Leukocytes,UA: NEGATIVE
Nitrite, UA: NEGATIVE
RBC, UA: NEGATIVE
Specific Gravity, UA: 1.026 (ref 1.005–1.030)
Urobilinogen, Ur: 0.2 mg/dL (ref 0.2–1.0)
pH, UA: 5 (ref 5.0–7.5)

## 2020-02-09 LAB — SPECIMEN STATUS REPORT

## 2020-02-09 LAB — HGB A1C W/O EAG: Hgb A1c MFr Bld: 6.6 % — ABNORMAL HIGH (ref 4.8–5.6)

## 2020-02-10 ENCOUNTER — Encounter: Payer: Self-pay | Admitting: Nutrition

## 2020-02-10 ENCOUNTER — Ambulatory Visit: Payer: 59 | Admitting: Family Medicine

## 2020-02-10 ENCOUNTER — Encounter: Payer: Self-pay | Admitting: Family Medicine

## 2020-02-10 ENCOUNTER — Encounter: Payer: 59 | Attending: Family Medicine | Admitting: Nutrition

## 2020-02-10 ENCOUNTER — Other Ambulatory Visit: Payer: Self-pay

## 2020-02-10 VITALS — BP 134/85 | HR 99 | Temp 98.0°F | Ht 66.0 in | Wt 247.0 lb

## 2020-02-10 DIAGNOSIS — E782 Mixed hyperlipidemia: Secondary | ICD-10-CM | POA: Diagnosis present

## 2020-02-10 DIAGNOSIS — E785 Hyperlipidemia, unspecified: Secondary | ICD-10-CM

## 2020-02-10 DIAGNOSIS — E119 Type 2 diabetes mellitus without complications: Secondary | ICD-10-CM | POA: Diagnosis present

## 2020-02-10 DIAGNOSIS — I10 Essential (primary) hypertension: Secondary | ICD-10-CM | POA: Diagnosis present

## 2020-02-10 NOTE — Patient Instructions (Signed)
Goals  Walk 4 days a week for 45 minutes a day Watch portion sizes.-measure foods out. Lose 1 lb per week.

## 2020-02-10 NOTE — Progress Notes (Signed)
Established Patient Office Visit  Subjective:  Patient ID: [name], female    DOB: 11-16-1965  Age: 54 y.o. MRN: [id number]  CC:  Chief Complaint  Patient presents with  . Diabetes    3 month f/u on diabetes. BS this am was 136    HPI Jazmeen A Dishner presents for DM-glucophage/amaryl-glucose readings fasting-125 HTN-130/80-Cozaar/HCTZ Asthma-ventolin/zyrtec Headaches-Imtrex prn-pt has not needed in awhile Hyperlipidemia-lipitor/fish oil-stable  Past Medical History:  Diagnosis Date  . Allergy   . Asthma   . Diabetes mellitus without complication ([place])   . Headache(784.0)    migraines occ  . Hypertension   . Recurrent upper respiratory infection (URI)    z pak finished 03/11/12    Past Surgical History:  Procedure Laterality Date  . ABDOMINAL HYSTERECTOMY    . BREAST SURGERY    . DILATION AND CURETTAGE OF UTERUS    . NASAL SINUS SURGERY     deviated septum    Family History  Problem Relation Age of Onset  . Cancer Mother   . Hypertension Mother     Social History   Socioeconomic History  . Marital status: Married    Spouse name: Not on file  . Number of children: Not on file  . Years of education: Not on file  . Highest education level: Not on file  Occupational History  . Not on file  Tobacco Use  . Smoking status: Never Smoker  . Smokeless tobacco: Never Used  Substance and Sexual Activity  . Alcohol use: No  . Drug use: No  . Sexual activity: Yes    Comment: hysterectomy  Other Topics Concern  . Not on file  Social History Narrative  . Not on file   Social Determinants of Health   Financial Resource Strain:   . Difficulty of Paying Living Expenses:   Food Insecurity:   . Worried About Charity fundraiser in the Last Year:   . Arboriculturist in the Last Year:   Transportation Needs:   . Film/video editor (Medical):   Marland Kitchen Lack of Transportation (Non-Medical):   Physical Activity:   . Days of Exercise per Week:   . Minutes  of Exercise per Session:   Stress:   . Feeling of Stress :   Social Connections:   . Frequency of Communication with Friends and Family:   . Frequency of Social Gatherings with Friends and Family:   . Attends Religious Services:   . Active Member of Clubs or Organizations:   . Attends Archivist Meetings:   Marland Kitchen Marital Status:   Intimate Partner Violence:   . Fear of Current or Ex-Partner:   . Emotionally Abused:   Marland Kitchen Physically Abused:   . Sexually Abused:     Outpatient Medications Prior to Visit  Medication Sig Dispense Refill  . albuterol (PROVENTIL HFA;VENTOLIN HFA) 108 (90 BASE) MCG/ACT inhaler Inhale 1 puff into the lungs every 6 (six) hours as needed. As needed for asthma.    Marland Kitchen atorvastatin (LIPITOR) 10 MG tablet Take 10 mg by mouth daily.    . cetirizine (ZYRTEC) 10 MG tablet Take 10 mg by mouth daily.    Marland Kitchen ESCITALOPRAM OXALATE PO Take 10 mg by mouth daily.    Marland Kitchen glimepiride (AMARYL) 2 MG tablet Take 1 tablet (2 mg total) by mouth daily before breakfast. 30 tablet 3  . hydrochlorothiazide (MICROZIDE) 12.5 MG capsule TAKE ONE CAPSULE BY MOUTH DAILY, ALONG WITH LOSARTAN 50 MG. 30  capsule 0  . losartan (COZAAR) 50 MG tablet TAKE ONE TABLET BY MOUTH DAILY, ALONG WITH HYDROCHLOROTHIAZIDE 12.5 MG 30 tablet 0  . metFORMIN (GLUCOPHAGE) 500 MG tablet Take 2 tablets (1,000 mg total) by mouth 2 (two) times daily with a meal. 480 tablet 1  . Omega-3 Fatty Acids (FISH OIL) 1200 MG CAPS Take 1 capsule by mouth daily.    Marland Kitchen OVER THE COUNTER MEDICATION Take 2 tablets by mouth daily. Flinstone Gummy Multivitamin.    . SUMAtriptan (IMITREX) 100 MG tablet Take 100 mg by mouth every 2 (two) hours as needed.     No facility-administered medications prior to visit.    Allergies  Allergen Reactions  . Cefoxitin Hives and Itching    ROS Review of Systems  HENT: Negative.   Eyes:       2/21 eye exam  Respiratory: Negative.   Cardiovascular: Negative.   Gastrointestinal: Negative.    Endocrine:       DM  Genitourinary: Negative.   Musculoskeletal: Negative.   Allergic/Immunologic: Positive for environmental allergies.  Psychiatric/Behavioral: Negative.       Objective:    Physical Exam  Constitutional: She is oriented to person, place, and time. She appears well-developed and well-nourished.  HENT:  Head: Normocephalic and atraumatic.  Eyes: Conjunctivae are normal.  Cardiovascular: Normal rate, regular rhythm, normal heart sounds and intact distal pulses.  Pulmonary/Chest: Effort normal and breath sounds normal.  Musculoskeletal:        General: No edema. Normal range of motion.  Neurological: She is alert and oriented to person, place, and time.  Psychiatric: She has a normal mood and affect. Her behavior is normal.    BP 134/85 (BP Location: Right Arm, Patient Position: Sitting, Cuff Size: Large)   Pulse 99   Temp 98 F (36.7 C) (Temporal)   Ht 5\' 6"  (1.676 m)   Wt 247 lb (112 kg)   SpO2 96%   BMI 39.87 kg/m  Wt Readings from Last 3 Encounters:  02/10/20 247 lb (112 kg)  11/12/19 246 lb (111.6 kg)  03/31/12 231 lb 3.2 oz (104.9 kg)     Health Maintenance Due  Topic Date Due  . Hepatitis C Screening  Never done  . FOOT EXAM  Never done  . OPHTHALMOLOGY EXAM  Never done  . COVID-19 Vaccine (1) Never done  . TETANUS/TDAP  Never done  . PAP SMEAR-Modifier  Never done  . COLONOSCOPY  Never done    Lab Results  Component Value Date   TSH 1.93 04/25/2019   Lab Results  Component Value Date   WBC 6.5 04/25/2019   HGB 13.9 04/25/2019   HCT 43 04/25/2019   MCV 85.4 03/14/2012   PLT 236 04/25/2019   Lab Results  Component Value Date   NA 141 02/08/2020   K 4.3 02/08/2020   CO2 30 (H) 02/08/2020   GLUCOSE 226 (H) 02/08/2020   BUN 19 02/08/2020   CREATININE 0.75 02/08/2020   BILITOT 0.4 02/08/2020   ALKPHOS 87 02/08/2020   AST 18 02/08/2020   ALT 16 02/08/2020   PROT 7.0 02/08/2020   ALBUMIN 4.5 02/08/2020   CALCIUM 9.5  02/08/2020   Lab Results  Component Value Date   CHOL 144 08/08/2019   Lab Results  Component Value Date   HDL 44 08/08/2019   Lab Results  Component Value Date   LDLCALC 74 08/08/2019   Lab Results  Component Value Date   TRIG 148 08/08/2019   No  results found for: [facility] Lab Results  Component Value Date   HGBA1C 6.6 (H) 02/08/2020      Assessment & Plan:  1. Diabetes mellitus without complication (HCC) 123456 [id number] to watch diet and take glucophage/amaryl  2. Essential hypertension HCTZ/Cozaar-stable-continue to take bp daily  3. Hyperlipidemia, unspecified hyperlipidemia type lipitor-lipid panel-LDL 55  Follow-up: 63months-primary care provider   Jerrik Housholder Hannah Beat, MD

## 2020-02-10 NOTE — Patient Instructions (Signed)
° ° ° °  If you have lab work done today you will be contacted with your lab results within the next 2 weeks.  If you have not heard from us then please contact us. The fastest way to get your results is to register for My Chart. ° ° °IF you received an x-ray today, you will receive an invoice from [facility]. Please contact [facility] at [phone] with questions or concerns regarding your invoice.  ° °IF you received labwork today, you will receive an invoice from [facility]. Please contact [facility] at [phone] with questions or concerns regarding your invoice.  ° °Our billing staff will not be able to assist you with questions regarding bills from these companies. ° °You will be contacted with the lab results as soon as they are available. The fastest way to get your results is to activate your My Chart account. Instructions are located on the last page of this paperwork. If you have not heard from us regarding the results in 2 weeks, please contact this office. °  ° ° ° °

## 2020-02-10 NOTE — Progress Notes (Signed)
  Medical Nutrition Therapy:  Appt start time: 1300 end time:  1330  Assessment:  Primary concerns today: Diabetes Type 2, Obesity. Lives with her husband. Cooks and shops. A1C 6.6%, down from 7.9%. She cut out sweet tea and her BS has improved greatly. Eating meals on time. . Glimepiride 2 g per day, Metformin 1000 mg BID. Changes: Eating more vegetables and fruit. Using the Plate Method. Watching portions. Wants to start exercising to lose weight now that she has her eating pattern down better.   Lab Results  Component Value Date   HGBA1C 6.6 (H) 02/08/2020   CMP Latest Ref Rng & Units 02/08/2020 08/08/2019 04/25/2019  Glucose 65 - 99 mg/dL 226(H) - -  BUN 6 - 24 mg/dL 19 - 20  Creatinine 0.57 - 1.00 mg/dL 0.75 - 0.7  Sodium 134 - 144 mmol/L 141 - 140  Potassium 3.5 - 5.2 mmol/L 4.3 - 4.7  Chloride 96 - 106 mmol/L 101 - 98(A)  CO2 20 - 29 mmol/L 30(H) - 28(A)  Calcium 8.7 - 10.2 mg/dL 9.5 - 10.0  Total Protein 6.0 - 8.5 g/dL 7.0 - -  Total Bilirubin 0.0 - 1.2 mg/dL 0.4 - -  Alkaline Phos 39 - 117 IU/L 87 76 -  AST 0 - 40 IU/L 18 24 -  ALT 0 - 32 IU/L 16 21 -      Preferred Learning Style:    Visual  Learning Readiness:   Ready  Change in progress   MEDICATIONS:    DIETARY INTAKE:  24-hr recall:  B ( AM): CIB SF with 1/2 banana Snk ( AM):   L ( PM): salad with meat, water Snk ( PM):  D ( PM): Lasagn smaller piece, salad and 1/2 slice bread, water or UNSwt. Snk ( PM):  Beverages: water or unsweet tea. Usual physical activity: Walks a dog.  Estimated energy needs: 1200  calories 135 g carbohydrates 90 g protein 33 g fat  Progress Towards Goal(s):  In progress.   Nutritional Diagnosis:  NB-1.1 Food and nutrition-related knowledge deficit As related to Diabetes Type 2.  As evidenced by A1C 7.9%..    Intervention:  Nutrition and Diabetes education provided on My Plate, CHO counting, meal planning, portion sizes, timing of meals, avoiding snacks between  meals unless having a low blood sugar, target ranges for A1C and blood sugars, signs/symptoms and treatment of hyper/hypoglycemia, monitoring blood sugars, taking medications as prescribed, benefits of exercising 30 minutes per day and prevention of complications of DM.   Goals Keep up the great job!!  Walk 4 days a week for 45 minutes a day Watch portion sizes.-measure foods out. Lose 1 lb per week.   Teaching Method Utilized:   Visual Auditory Hands on  Handouts given during visit include:  The Plate Method   Meal Plan Card  Diabetes Instructions. none  Barriers to learning/adherence to lifestyle change:   Demonstrated degree of understanding via:  Teach Back   Monitoring/Evaluation:  Dietary intake, exercise, , and body weight in 3  month(s).

## 2020-02-11 ENCOUNTER — Encounter: Payer: Self-pay | Admitting: Emergency Medicine

## 2020-02-11 NOTE — Progress Notes (Signed)
Lab Letter mailed

## 2020-02-24 ENCOUNTER — Other Ambulatory Visit: Payer: Self-pay | Admitting: Family Medicine

## 2020-03-15 ENCOUNTER — Other Ambulatory Visit: Payer: Self-pay

## 2020-03-15 DIAGNOSIS — E119 Type 2 diabetes mellitus without complications: Secondary | ICD-10-CM

## 2020-03-15 MED ORDER — GLIMEPIRIDE 2 MG PO TABS: 2.0000 mg | ORAL_TABLET | Freq: Every day | ORAL | 3 refills | Status: DC

## 2020-03-25 ENCOUNTER — Other Ambulatory Visit: Payer: Self-pay | Admitting: Family Medicine

## 2020-03-30 ENCOUNTER — Other Ambulatory Visit: Payer: Self-pay | Admitting: *Deleted

## 2020-03-30 ENCOUNTER — Telehealth: Payer: Self-pay | Admitting: Student

## 2020-03-30 ENCOUNTER — Other Ambulatory Visit: Payer: Self-pay

## 2020-03-30 ENCOUNTER — Encounter: Payer: Self-pay | Admitting: Family Medicine

## 2020-03-30 ENCOUNTER — Ambulatory Visit: Payer: 59 | Admitting: Family Medicine

## 2020-03-30 DIAGNOSIS — E669 Obesity, unspecified: Secondary | ICD-10-CM | POA: Insufficient documentation

## 2020-03-30 DIAGNOSIS — I10 Essential (primary) hypertension: Secondary | ICD-10-CM

## 2020-03-30 DIAGNOSIS — E119 Type 2 diabetes mellitus without complications: Secondary | ICD-10-CM

## 2020-03-30 DIAGNOSIS — Z6841 Body Mass Index (BMI) 40.0 and over, adult: Secondary | ICD-10-CM | POA: Diagnosis not present

## 2020-03-30 HISTORY — DX: Obesity, unspecified: E66.9

## 2020-03-30 MED ORDER — SUMATRIPTAN SUCCINATE 100 MG PO TABS: 100.0000 mg | ORAL_TABLET | ORAL | 2 refills | Status: DC | PRN

## 2020-03-30 MED ORDER — HYDROCHLOROTHIAZIDE 12.5 MG PO CAPS: ORAL_CAPSULE | ORAL | 0 refills | Status: DC

## 2020-03-30 MED ORDER — LOSARTAN POTASSIUM 50 MG PO TABS: ORAL_TABLET | ORAL | 0 refills | Status: DC

## 2020-03-30 NOTE — Assessment & Plan Note (Signed)
Is on a statin medication.  Is on an ARB.  A1c was 6.6 back in April.  Possible ability to reduce if A1c is at this level or lower on recheck. Encourage activity level for 30 to 60 minutes a day. Continue current medications. Is on statin as well. She is reminded the importance of maintaining  good blood sugars,  taking medications as directed, daily foot care, annual eye exams. Additionally educated about keeping good control over blood pressure and cholesterol as well.

## 2020-03-30 NOTE — Progress Notes (Addendum)
Subjective:  Patient ID: [name], female    DOB: 1966-03-27  Age: 54 y.o. MRN: [id number]  CC:  Chief Complaint  Patient presents with  . New Patient (Initial Visit)    new pt previous dr corum/ hawkins pt would like to get a1C checked so she could come off medication if she could       HPI  HPI  [name] is a 54 year old female patient who presents today to establish care.  Previously seen a few times by Dr. Holly Bodily after Dr. Luan Pulling retired.  She has a history that is consistent with hypertension, migraines, type 2 diabetes without complication, hyperlipidemia, depression, morbid obesity.  She has had a Cologuard it was negative.  She is up-to-date on her mammogram.  She has had breast calcifications found in the past.  Her Pap smear was in February per her.  She gets yearly eye exams though she does not remember the exact date.  Her last foot exam was in April.  She has had pneumonia vaccines.  She reports taking all her medications without any issues or concerns.  She denies having any true issues today to address.  Outside of wanting to possibly be weaned off of the Escitalopram.  And changing of diabetic medications if her A1c is improved.  She denies having any chest pain, leg swelling, palpitations, cough, shortness of breath, headaches, vision changes, dizziness at this time in the office.  Or recently.  She does suffer from migraines and needs a refill of her Imitrex has not been filled for almost 6 months. She lives with her husband [name] of 104 years.  She has one child.  She has a cat and two dogs at home.  She enjoys reading and watching TV.  She eats a well-balanced diet.  Does drink water but could drink more.  Today patient denies signs and symptoms of COVID 19 infection including fever, chills, cough, shortness of breath, and headache. Past Medical, Surgical, Social History, Allergies, and Medications have been Reviewed.   Past Medical History:  Diagnosis Date   . Allergy   . Asthma   . Asthma    Phreesia 03/27/2020  . Breast calcification, left 02/25/2012  . Diabetes mellitus without complication ([place])   . Headache(784.0)    migraines occ  . Hypertension   . Recurrent upper respiratory infection (URI)    z pak finished 03/11/12    Current Meds  Medication Sig  . albuterol (PROVENTIL HFA;VENTOLIN HFA) 108 (90 BASE) MCG/ACT inhaler Inhale 1 puff into the lungs every 6 (six) hours as needed. As needed for asthma.  Marland Kitchen atorvastatin (LIPITOR) 10 MG tablet Take 10 mg by mouth daily.  . cetirizine (ZYRTEC) 10 MG tablet Take 10 mg by mouth daily.  Marland Kitchen ESCITALOPRAM OXALATE PO Take 10 mg by mouth daily.  Marland Kitchen glimepiride (AMARYL) 2 MG tablet Take 1 tablet (2 mg total) by mouth daily before breakfast.  . metFORMIN (GLUCOPHAGE) 500 MG tablet TAKE TWO (2) TABLETS BY MOUTH 2 TIMES DAILY  . Omega-3 Fatty Acids (FISH OIL) 1200 MG CAPS Take 1 capsule by mouth daily.  Marland Kitchen OVER THE COUNTER MEDICATION Take 2 tablets by mouth daily. Womens Mutilvitamin  . [DISCONTINUED] hydrochlorothiazide (MICROZIDE) 12.5 MG capsule TAKE ONE CAPSULE BY MOUTH DAILY, ALONG WITH LOSARTAN 50 MG.  . [DISCONTINUED] losartan (COZAAR) 50 MG tablet TAKE ONE TABLET BY MOUTH DAILY, ALONG WITH HYDROCHLOROTHIAZIDE 12.5 MG  . [DISCONTINUED] SUMAtriptan (IMITREX) 100 MG tablet Take 100 mg  by mouth every 2 (two) hours as needed.    ROS:  Review of Systems  Constitutional: Negative.   HENT: Negative.   Eyes: Negative.   Respiratory: Negative.   Cardiovascular: Negative.   Gastrointestinal: Negative.   Genitourinary: Negative.   Musculoskeletal: Negative.   Skin: Negative.   Neurological: Negative.   Endo/Heme/Allergies: Negative.   Psychiatric/Behavioral: Negative.   All other systems reviewed and are negative.    Objective:   Today's Vitals: BP 140/90   Pulse (!) 105   Temp 97.8 F (36.6 C) (Temporal)   Ht '5\' 6"'$  (1.676 m)   Wt 254 lb 1.9 oz (115.3 kg)   SpO2 93%   BMI 41.02 kg/m   Vitals with BMI 03/30/2020 03/30/2020 02/10/2020  Height - '5\' 6"'$  '5\' 6"'$   Weight - 254 lbs 2 oz 247 lbs  BMI - 63.33 54.56  Systolic 256 389 373  Diastolic 90 428 85  Pulse - 105 99     Physical Exam Vitals and nursing note reviewed.  Constitutional:      Appearance: Normal appearance. She is well-developed and well-groomed. She is obese.  HENT:     Head: Normocephalic and atraumatic.     Right Ear: External ear normal.     Left Ear: External ear normal.     Mouth/Throat:     Comments: Mask in place Eyes:     General:        Right eye: No discharge.        Left eye: No discharge.     Conjunctiva/sclera: Conjunctivae normal.     Comments: Glasses  Cardiovascular:     Rate and Rhythm: Normal rate and regular rhythm.     Pulses: Normal pulses.     Heart sounds: Normal heart sounds.  Pulmonary:     Effort: Pulmonary effort is normal.     Breath sounds: Normal breath sounds.  Musculoskeletal:        General: Normal range of motion.     Cervical back: Normal range of motion and neck supple.  Skin:    General: Skin is warm.  Neurological:     General: No focal deficit present.     Mental Status: She is alert and oriented to person, place, and time.  Psychiatric:        Attention and Perception: Attention and perception normal.        Mood and Affect: Mood and affect normal.        Speech: Speech normal.        Behavior: Behavior normal. Behavior is cooperative.        Thought Content: Thought content normal.        Cognition and Memory: Cognition and memory normal.        Judgment: Judgment normal.     Comments: Very pleasant communication, good eye contact      Assessment   1. Morbid obesity with BMI of 40.0-44.9, adult ([place])   2. Diabetes mellitus without complication ([place])   3. Essential hypertension     Tests ordered Orders Placed This Encounter  Procedures  . Hemoglobin A1c  . CMP14+EGFR     Plan: Please see assessment and plan per problem list  above.   No orders of the defined types were placed in this encounter.   Patient to follow-up in November for annual  Perlie Mayo, NP

## 2020-03-30 NOTE — Patient Instructions (Addendum)
I appreciate the opportunity to provide you with care for your health and wellness. Today we discussed: establish care   Follow up: Nov for annual, fasting labs that day at [facility], a1c in office  Labs after May 09, 2020   No referrals today  Great to meet you today :)  Medication changes: Reduce your escitalopram to 5 mg daily for 3 weeks; then every other day for 3 weeks. If you have headaches or feel sick please call and we will slow down the wean. If you feel like you do need the 10 mg then we will go back to it.  Please continue to practice social distancing to keep you, your family, and our community safe.  If you must go out, please wear a mask and practice good handwashing.  It was a pleasure to see you and I look forward to continuing to work together on your health and well-being. Please do not hesitate to call the office if you need care or have questions about your care.  Have a wonderful day and week. With Gratitude, Cherly Beach, DNP, AGNP-BC

## 2020-03-30 NOTE — Assessment & Plan Note (Signed)
Obesity linked to diabetes and hypertension  about the importance of exercise daily to help with weight management. A minumum of 30 minutes daily is recommended. Additionally, importance of healthy food choices  with portion control discussed.  Wt Readings from Last 3 Encounters:  03/30/20 254 lb 1.9 oz (115.3 kg)  02/10/20 247 lb (112 kg)  11/12/19 246 lb (111.6 kg)

## 2020-03-30 NOTE — Assessment & Plan Note (Signed)
[name] is encouraged to maintain a well balanced diet that is low in salt. Elevated today but she reports her stress continue current medication regimen. Refills provided     Additionally, she is also reminded that exercise is beneficial for heart health and control of  Blood pressure. 30-60 minutes daily is recommended-walking was suggested.

## 2020-03-31 NOTE — Telephone Encounter (Signed)
It should say do not exceed 2 doses in 24 hours. Thank you for follow up.

## 2020-03-31 NOTE — Telephone Encounter (Signed)
Pharmacy called regarding the headache medicine script that was faxed in yesterday. The directions were for every 2 hours and did not say do not exceed 2 doses in 24 hours as usual. Advised them to add do not exceed 2 doses in 24 hours.

## 2020-04-29 ENCOUNTER — Other Ambulatory Visit: Payer: Self-pay | Admitting: *Deleted

## 2020-04-29 MED ORDER — LOSARTAN POTASSIUM 50 MG PO TABS: ORAL_TABLET | ORAL | 0 refills | Status: DC

## 2020-04-29 MED ORDER — HYDROCHLOROTHIAZIDE 12.5 MG PO CAPS: ORAL_CAPSULE | ORAL | 0 refills | Status: DC

## 2020-04-29 MED ORDER — METFORMIN HCL 500 MG PO TABS: ORAL_TABLET | ORAL | 0 refills | Status: DC

## 2020-05-09 ENCOUNTER — Other Ambulatory Visit: Payer: Self-pay | Admitting: *Deleted

## 2020-05-09 MED ORDER — ATORVASTATIN CALCIUM 10 MG PO TABS: 10.0000 mg | ORAL_TABLET | Freq: Every day | ORAL | 0 refills | Status: DC

## 2020-05-12 ENCOUNTER — Ambulatory Visit: Payer: 59 | Admitting: Nutrition

## 2020-06-01 ENCOUNTER — Telehealth: Payer: Self-pay

## 2020-06-01 ENCOUNTER — Other Ambulatory Visit: Payer: Self-pay | Admitting: Family Medicine

## 2020-06-01 DIAGNOSIS — I10 Essential (primary) hypertension: Secondary | ICD-10-CM

## 2020-06-01 MED ORDER — LOSARTAN POTASSIUM-HCTZ 50-12.5 MG PO TABS: 1.0000 | ORAL_TABLET | Freq: Every day | ORAL | 3 refills | Status: DC

## 2020-06-01 NOTE — Telephone Encounter (Signed)
Please see if she is doing well on both medications, if so I can combo them to one pill to make it easier.  Thanks

## 2020-06-01 NOTE — Progress Notes (Signed)
losar

## 2020-06-01 NOTE — Telephone Encounter (Signed)
Pt needing refill on Microzide, and losartan 50 mg

## 2020-06-02 LAB — HEMOGLOBIN A1C
Est. average glucose Bld gHb Est-mCnc: 154 mg/dL
Hgb A1c MFr Bld: 7 % — ABNORMAL HIGH (ref 4.8–5.6)

## 2020-06-02 LAB — CMP14+EGFR
ALT: 18 IU/L (ref 0–32)
AST: 17 IU/L (ref 0–40)
Albumin/Globulin Ratio: 1.9 (ref 1.2–2.2)
Albumin: 4.5 g/dL (ref 3.8–4.9)
Alkaline Phosphatase: 76 IU/L (ref 48–121)
BUN/Creatinine Ratio: 31 — ABNORMAL HIGH (ref 9–23)
BUN: 19 mg/dL (ref 6–24)
Bilirubin Total: 0.4 mg/dL (ref 0.0–1.2)
CO2: 28 mmol/L (ref 20–29)
Calcium: 9.6 mg/dL (ref 8.7–10.2)
Chloride: 100 mmol/L (ref 96–106)
Creatinine, Ser: 0.61 mg/dL (ref 0.57–1.00)
GFR calc Af Amer: 117 mL/min/{1.73_m2} (ref >59)
GFR calc non Af Amer: 102 mL/min/{1.73_m2} (ref >59)
Globulin, Total: 2.4 g/dL (ref 1.5–4.5)
Glucose: 151 mg/dL — ABNORMAL HIGH (ref 65–99)
Potassium: 4.2 mmol/L (ref 3.5–5.2)
Sodium: 141 mmol/L (ref 134–144)
Total Protein: 6.9 g/dL (ref 6.0–8.5)

## 2020-06-08 ENCOUNTER — Encounter: Payer: 59 | Admitting: Family Medicine

## 2020-06-09 ENCOUNTER — Encounter: Payer: Self-pay | Admitting: Family Medicine

## 2020-06-09 ENCOUNTER — Ambulatory Visit (INDEPENDENT_AMBULATORY_CARE_PROVIDER_SITE_OTHER): Payer: 59 | Admitting: Family Medicine

## 2020-06-09 ENCOUNTER — Other Ambulatory Visit: Payer: Self-pay

## 2020-06-09 VITALS — BP 132/84 | HR 86 | Temp 97.4°F | Resp 18 | Ht 66.0 in | Wt 253.4 lb

## 2020-06-09 DIAGNOSIS — Z6839 Body mass index (BMI) 39.0-39.9, adult: Secondary | ICD-10-CM | POA: Insufficient documentation

## 2020-06-09 DIAGNOSIS — E119 Type 2 diabetes mellitus without complications: Secondary | ICD-10-CM

## 2020-06-09 DIAGNOSIS — Z23 Encounter for immunization: Secondary | ICD-10-CM | POA: Diagnosis not present

## 2020-06-09 DIAGNOSIS — Z0001 Encounter for general adult medical examination with abnormal findings: Secondary | ICD-10-CM | POA: Insufficient documentation

## 2020-06-09 DIAGNOSIS — Z6841 Body Mass Index (BMI) 40.0 and over, adult: Secondary | ICD-10-CM

## 2020-06-09 DIAGNOSIS — N393 Stress incontinence (female) (male): Secondary | ICD-10-CM | POA: Insufficient documentation

## 2020-06-09 DIAGNOSIS — E785 Hyperlipidemia, unspecified: Secondary | ICD-10-CM

## 2020-06-09 DIAGNOSIS — I10 Essential (primary) hypertension: Secondary | ICD-10-CM

## 2020-06-09 DIAGNOSIS — N951 Menopausal and female climacteric states: Secondary | ICD-10-CM

## 2020-06-09 DIAGNOSIS — Z1211 Encounter for screening for malignant neoplasm of colon: Secondary | ICD-10-CM

## 2020-06-09 HISTORY — DX: Stress incontinence (female) (male): N39.3

## 2020-06-09 HISTORY — DX: Menopausal and female climacteric states: N95.1

## 2020-06-09 NOTE — Assessment & Plan Note (Signed)
Discussed monthly self breast exams and yearly mammograms; at least 30 minutes of aerobic activity at least 5 days/week and weight-bearing exercise 2x/week; proper sunscreen use reviewed; healthy diet, including goals of calcium and vitamin D intake and alcohol recommendations (less than or equal to 1 drink/day) reviewed; regular seatbelt use; changing batteries in smoke detectors.  Immunization recommendations discussed.  Colonoscopy recommendations reviewed.  

## 2020-06-09 NOTE — Assessment & Plan Note (Signed)
Continue Lipitor.  We will get checked prior to next appointment. Heart healthy diet encouraged.

## 2020-06-09 NOTE — Assessment & Plan Note (Signed)
[name] is encouraged to maintain a well balanced diet that is low in salt. Controlled, continue current medication regimen. she is also reminded that exercise is beneficial for heart health and control of  Blood pressure. 30-60 minutes daily is recommended-walking was suggested.

## 2020-06-09 NOTE — Assessment & Plan Note (Signed)
Is on a statin medication.  Is on ARB.  A1c back in April was 6.6.  Recheck on August 17 demonstrated 7%.  Encourage activity for 30 to 60 minutes a day.  Continue all current medications.  Reviewed things that she can do to help keep A1c from climbing again.  Will recheck closely in 3 months.  Foot exam performed today. She is reminded the importance of maintaining  good blood sugars,  taking medications as directed, daily foot care, annual eye exams. Additionally educated about keeping good control over blood pressure and cholesterol as well.

## 2020-06-09 NOTE — Patient Instructions (Addendum)
I appreciate the opportunity to provide you with care for your health and wellness. Today we discussed: established care  Follow up: 3 months for A1c check  Labs-Fasting before next appt No referrals today Orders: Flu shot and Cologaurd  Stay Safe!  Continue to work on diet and exercise :)  Please continue to practice social distancing to keep you, your family, and our community safe.  If you must go out, please wear a mask and practice good handwashing.  It was a pleasure to see you and I look forward to continuing to work together on your health and well-being. Please do not hesitate to call the office if you need care or have questions about your care.  Have a wonderful day and week. With Gratitude, Cherly Beach, DNP, AGNP-BC  HEALTH MAINTENANCE RECOMMENDATIONS:  It is recommended that you get at least 30 minutes of aerobic exercise at least 5 days/week (for weight loss, you may need as much as 60-90 minutes). This can be any activity that gets your heart rate up. This can be divided in 10-15 minute intervals if needed, but try and build up your endurance at least once a week.  Weight bearing exercise is also recommended twice weekly.  Eat a healthy diet with lots of vegetables, fruits and fiber.  "Colorful" foods have a lot of vitamins (ie green vegetables, tomatoes, red peppers, etc).  Limit sweet tea, regular sodas and alcoholic beverages, all of which has a lot of calories and sugar.  Up to 1 alcoholic drink daily may be beneficial for women (unless trying to lose weight, watch sugars).  Drink a lot of water.  Calcium recommendations are 1200-1500 mg daily (1500 mg for postmenopausal women or women without ovaries), and vitamin D 1000 IU daily.  This should be obtained from diet and/or supplements (vitamins), and calcium should not be taken all at once, but in divided doses.  Monthly self breast exams and yearly mammograms for women over the age of 1 is  recommended.  Sunscreen of at least SPF 30 should be used on all sun-exposed parts of the skin when outside between the hours of 10 am and 4 pm (not just when at beach or pool, but even with exercise, golf, tennis, and yard work!)  Use a sunscreen that says "broad spectrum" so it covers both UVA and UVB rays, and make sure to reapply every 1-2 hours.  Remember to change the batteries in your smoke detectors when changing your clock times in the spring and fall.  Use your seat belt every time you are in a car, and please drive safely and not be distracted with cell phones and texting while driving.

## 2020-06-09 NOTE — Assessment & Plan Note (Signed)
Cologuard ordered.  Patient declined colonoscopy reports that she does not feel like she needs it at this time

## 2020-06-09 NOTE — Assessment & Plan Note (Addendum)
Obesity is linked to hypertension, hyperlipidemia, diabetes  Deteriorated,  She is encouraged to eat a well-balanced diet that is diabetic heart friendly and  30 minutes of exercise daily  Wt Readings from Last 3 Encounters:  06/09/20 253 lb 6.4 oz (114.9 kg)  03/30/20 254 lb 1.9 oz (115.3 kg)  02/10/20 247 lb (112 kg)

## 2020-06-09 NOTE — Assessment & Plan Note (Signed)

## 2020-06-09 NOTE — Progress Notes (Addendum)
Health Maintenance reviewed -   Immunization History  Administered Date(s) Administered  . Influenza Inj Mdck Quad Pf 08/29/2016  . Influenza,inj,Quad PF,6+ Mos 07/25/2017, 07/24/2018, 08/10/2019, 06/09/2020  . Influenza-Unspecified 07/22/2012, 08/18/2013, 08/03/2014, 08/08/2015  . Pneumococcal Conjugate-13 10/27/2018  . Pneumococcal Polysaccharide-23 08/03/2014, 08/10/2019   Last Pap smear:  [facility] Last mammogram: 03/2020 Last colonoscopy: Cologuard  Last DEXA: Due at 7 Dentist: Yes- yesterday  Ophtho: glasses -up to date Exercise: not as much as should, enjoys walking Smoker: no  Alcohol Use: no  Other doctors caring for patient include:  Patient Care Team: Perlie Mayo, NP as PCP - General (Family Medicine)  End of Life Discussion:  Patient does not have a living will and medical power of attorney  Subjective:   HPI  [name] is a 54 y.o. female who presents for annual wellness visit and follow-up on chronic medical conditions.  She has the following concerns: none.   Denies having any sleep issues overall.  Occasional sleep trouble but does not feel that she needs anything for it.  Needs a crown but overall not having problems with any dentition.  Chewing and swallowing okay.  Denies having any changes in bowel or bladder habits.  Denies having any blood in urine or stool.  Is willing to do Cologuard, does not want to do a colonoscopy yet at this time.  Denies memory issues or trauma to the brain.  Denies having any injuries or falls recently.  Denies having any skin issues.  Denies having any hearing issues.  Does wear glasses.  Sees my eye doctor in [place].  Overall vision is okay.  Is willing to get flu shot today. Gets Pap smears at OB/GYN in [place].   Review Of Systems  Review of Systems  Constitutional: Negative.   HENT: Negative.   Eyes: Negative.   Respiratory: Negative.   Cardiovascular: Negative.   Gastrointestinal:  Negative.   Endocrine: Positive for polydipsia.  Genitourinary: Negative.   Musculoskeletal: Negative.   Skin: Negative.   Neurological: Negative.   Psychiatric/Behavioral: Negative.     Objective:   PHYSICAL EXAM:  BP 132/84 (BP Location: Right Arm, Patient Position: Sitting, Cuff Size: Normal)   Pulse 86   Temp (!) 97.4 F (36.3 C) (Temporal)   Resp 18   Ht 5\' 6"  (1.676 m)   Wt 253 lb 6.4 oz (114.9 kg)   SpO2 97%   BMI 40.90 kg/m   Physical Exam Vitals and nursing note reviewed.  Constitutional:      Appearance: Normal appearance. She is well-developed and well-groomed. She is morbidly obese.  HENT:     Head: Normocephalic.     Right Ear: Hearing, tympanic membrane, ear canal and external ear normal.     Left Ear: Hearing, tympanic membrane, ear canal and external ear normal.     Nose: Nose normal.     Mouth/Throat:     Lips: Pink.     Mouth: Mucous membranes are moist.     Pharynx: Oropharynx is clear. Uvula midline.  Eyes:     General: Lids are normal.     Extraocular Movements: Extraocular movements intact.     Conjunctiva/sclera: Conjunctivae normal.     Pupils: Pupils are equal, round, and reactive to light.  Neck:     Thyroid: No thyroid mass, thyromegaly or thyroid tenderness.  Cardiovascular:     Rate and Rhythm: Normal rate and regular rhythm.     Pulses: Normal pulses.  Radial pulses are 2+ on the right side and 2+ on the left side.       Dorsalis pedis pulses are 2+ on the right side and 2+ on the left side.     Heart sounds: Normal heart sounds.  Pulmonary:     Effort: Pulmonary effort is normal.     Breath sounds: Normal breath sounds.  Abdominal:     General: Abdomen is flat. Bowel sounds are normal.     Palpations: Abdomen is soft.  Musculoskeletal:        General: Normal range of motion.     Cervical back: Normal range of motion and neck supple.     Right lower leg: No edema.     Left lower leg: No edema.  Lymphadenopathy:      Cervical: No cervical adenopathy.  Skin:    General: Skin is warm and dry.     Capillary Refill: Capillary refill takes less than 2 seconds.  Neurological:     General: No focal deficit present.     Mental Status: She is alert and oriented to person, place, and time. Mental status is at baseline.     Cranial Nerves: Cranial nerves are intact.     Sensory: Sensation is intact.     Motor: Motor function is intact.     Coordination: Coordination is intact.     Gait: Gait is intact.     Deep Tendon Reflexes: Reflexes are normal and symmetric.  Psychiatric:        Attention and Perception: Attention and perception normal.        Mood and Affect: Mood and affect normal.        Speech: Speech normal.        Behavior: Behavior normal. Behavior is cooperative.        Thought Content: Thought content normal.        Cognition and Memory: Cognition and memory normal.        Judgment: Judgment normal.     Comments: Good communication, good eye contact    Diabetic Foot Form - Detailed   Diabetic Foot Exam - detailed Diabetic Foot exam was performed with the following findings: Yes 06/09/2020  8:45 AM  Visual Foot Exam completed.: Yes  Can the patient see the bottom of their feet?: Yes Are the shoes appropriate in style and fit?: Yes Is there swelling or and abnormal foot shape?: No Is there a claw toe deformity?: No Is there elevated skin temparature?: No Is there foot or ankle muscle weakness?: No Normal Range of Motion: Yes Pulse Foot Exam completed.: Yes  Right posterior Tibialias: Present Left posterior Tibialias: Present  Right Dorsalis Pedis: Present Left Dorsalis Pedis: Present  Sensory Foot Exam Completed.: Yes Semmes-Weinstein Monofilament Test R Site 1-Great Toe: Pos L Site 1-Great Toe: Pos         Depression Screening  Depression screen [facility] 2/9 03/30/2020 02/10/2020 12/15/2019 11/12/2019 11/12/2019  Decreased Interest 0 0 0 0 0  Down, Depressed, Hopeless 0 0 0 0 0  PHQ - 2  Score 0 0 0 0 0  Altered sleeping 1 - - 1 -  Tired, decreased energy 0 - - 0 -  Change in appetite 0 - - 0 -  Feeling bad or failure about yourself  0 - - 0 -  Trouble concentrating 0 - - 0 -  Moving slowly or fidgety/restless 0 - - 0 -  Suicidal thoughts 0 - - 0 -  PHQ-9 Score  1 - - 1 -  Difficult doing work/chores Not difficult at all - - Not difficult at all -     Falls  Fall Risk  06/09/2020 03/30/2020 02/10/2020 12/16/2019 12/15/2019  Falls in the past year? 0 0 0 0 0  Number falls in past yr: 0 0 0 0 0  Injury with Fall? 0 0 0 0 0  Risk for fall due to : No Fall Risks No Fall Risks - - -  Follow up Falls evaluation completed Falls evaluation completed - - -    Assessment & Plan:   1. Annual visit for general adult medical examination with abnormal findings   2. Morbid obesity with BMI of 40.0-44.9, adult ([place])   3. Diabetes mellitus without complication ([place])   4. Essential hypertension   5. Hyperlipidemia, unspecified hyperlipidemia type   6. Encounter for screening for malignant neoplasm of colon   7. Need for immunization against influenza     Tests ordered Orders Placed This Encounter  Procedures  . Flu Vaccine QUAD 36+ mos IM  . CBC  . Comprehensive metabolic panel  . Hemoglobin A1c  . Lipid panel  . TSH  . VITAMIN D 25 Hydroxy (Vit-D Deficiency, Fractures)  . Cologuard     Plan: Please see assessment and plan per problem list above.   No orders of the defined types were placed in this encounter.    I have personally reviewed: The patient's medical and social history Their use of alcohol, tobacco or illicit drugs Their current medications and supplements The patient's functional ability including ADLs,fall risks, home safety risks, cognitive, and hearing and visual impairment Diet and physical activities Evidence for depression or mood disorders  The patient's weight, height, BMI, and visual acuity have been recorded in the chart.  I have made  referrals, counseling, and provided education to the patient based on review of the above and I have provided the patient with a written personalized care plan for preventive services.    Note: This dictation was prepared with Dragon dictation along with smaller phrase technology. Similar sounding words can be transcribed inadequately or may not be corrected upon review. Any transcriptional errors that result from this process are unintentional.      Perlie Mayo, NP   06/09/2020

## 2020-06-13 ENCOUNTER — Other Ambulatory Visit: Payer: Self-pay

## 2020-06-13 ENCOUNTER — Encounter: Payer: 59 | Attending: Family Medicine | Admitting: Nutrition

## 2020-06-13 ENCOUNTER — Encounter: Payer: Self-pay | Admitting: Nutrition

## 2020-06-13 VITALS — Ht 65.0 in | Wt 252.6 lb

## 2020-06-13 DIAGNOSIS — I10 Essential (primary) hypertension: Secondary | ICD-10-CM | POA: Diagnosis present

## 2020-06-13 DIAGNOSIS — E782 Mixed hyperlipidemia: Secondary | ICD-10-CM | POA: Diagnosis present

## 2020-06-13 DIAGNOSIS — E119 Type 2 diabetes mellitus without complications: Secondary | ICD-10-CM | POA: Diagnosis not present

## 2020-06-13 NOTE — Patient Instructions (Addendum)
Goals  Make better food choices when you go out to eat.  Eat consistently throughout the day.  Three balanced meals! Breakfast at 7 Lunch at 12 noon Dinner at 7  Portion control. Eat 30g of carbs per meal. Follow your plan on the yellow card!  Make your grocery list with your plan in mind.  Get on the treadmill after breakfast

## 2020-06-13 NOTE — Progress Notes (Signed)
Medical Nutrition Therapy:  Appt start time: 1300 end time:  1330  Assessment:  Primary concerns today: Diabetes Type 2, Obesity. Lives with her husband. Cooks and shops.   A1c is at 7.0%, up from 6.6% last visit Portion control is where patient sees her biggest hurdle.  Patient reports needing to exercise more, has a treadmill at home but hasn't been motivated to start using it. Was walking before, but heat and schedule have gotten in the way. Peace and quiet will help her to to exercise. Has a 56 year old grandson living with her who is about to start school again. Taking her medications as prescribed.  Wt Readings from Last 3 Encounters:  06/13/20 252 lb 9.6 oz (114.6 kg)  06/09/20 253 lb 6.4 oz (114.9 kg)  03/30/20 254 lb 1.9 oz (115.3 kg)   Ht Readings from Last 3 Encounters:  06/13/20 5\' 5"  (1.651 m)  06/09/20 5\' 6"  (1.676 m)  03/30/20 5\' 6"  (1.676 m)   Body mass index is 42.03 kg/m. @BMIFA @ Facility age limit for growth percentiles is 20 years. Facility age limit for growth percentiles is 20 years.  Lab Results  Component Value Date   HGBA1C 7.0 (H) 05/31/2020   CMP Latest Ref Rng & Units 05/31/2020 02/08/2020 08/08/2019  Glucose 65 - 99 mg/dL 151(H) 226(H) -  BUN 6 - 24 mg/dL 19 19 -  Creatinine 0.57 - 1.00 mg/dL 0.61 0.75 -  Sodium 134 - 144 mmol/L 141 141 -  Potassium 3.5 - 5.2 mmol/L 4.2 4.3 -  Chloride 96 - 106 mmol/L 100 101 -  CO2 20 - 29 mmol/L 28 30(H) -  Calcium 8.7 - 10.2 mg/dL 9.6 9.5 -  Total Protein 6.0 - 8.5 g/dL 6.9 7.0 -  Total Bilirubin 0.0 - 1.2 mg/dL 0.4 0.4 -  Alkaline Phos 48 - 121 IU/L 76 87 76  AST 0 - 40 IU/L 17 18 24   ALT 0 - 32 IU/L 18 16 21       Preferred Learning Style:    Visual  Learning Readiness:   Ready  Change in progress   MEDICATIONS:    DIETARY INTAKE:  24-hr recall:  B ( AM): Carnation Breakfast Essentials light start Snk ( AM):  none L ( PM): chicken tenders and small salad Snk ( PM): none D (  PM):Popcorn Snk ( PM): none Beverages: water or unsweet tea. Usual physical activity: Walks a dog.  Estimated energy needs: 1200  calories 135 g carbohydrates 90 g protein 33 g fat  Progress Towards Goal(s):  In progress.   Nutritional Diagnosis:  NB-1.1 Food and nutrition-related knowledge deficit As related to Diabetes Type 2.  As evidenced by A1C 7.9%..    Intervention:  Nutrition and Diabetes education provided on My Plate, CHO counting, meal planning, portion sizes, timing of meals, avoiding snacks between meals unless having a low blood sugar, target ranges for A1C and blood sugars, signs/symptoms and treatment of hyper/hypoglycemia, monitoring blood sugars, taking medications as prescribed, benefits of exercising 30 minutes per day and prevention of complications of DM.   Goals   Make better food choices when you go out to eat.  Eat consistently throughout the day.   Three meals!  Breakfast at 7  Lunch at 12 noon  Dinner at 7  Portion control. Eat 30g of carbs per meal.  Roast your vegetables  Get on the treadmill after breakfast   Teaching Method Utilized:   Visual Auditory Hands on  Handouts given during  visit include:  The Plate Method   Meal Plan Card  Diabetes Instructions. none  Barriers to learning/adherence to lifestyle change:   Demonstrated degree of understanding via:  Teach Back   Monitoring/Evaluation:  Dietary intake, exercise, and body weight in 3  month(s).

## 2020-06-15 ENCOUNTER — Other Ambulatory Visit: Payer: Self-pay | Admitting: General Surgery

## 2020-06-15 DIAGNOSIS — N6320 Unspecified lump in the left breast, unspecified quadrant: Secondary | ICD-10-CM

## 2020-06-17 LAB — COLOGUARD: Cologuard: NEGATIVE

## 2020-06-24 LAB — EXTERNAL GENERIC LAB PROCEDURE: COLOGUARD: NEGATIVE

## 2020-06-24 LAB — COLOGUARD
COLOGUARD: NEGATIVE
Cologuard: NEGATIVE

## 2020-06-28 ENCOUNTER — Other Ambulatory Visit: Payer: Self-pay | Admitting: Family Medicine

## 2020-06-28 DIAGNOSIS — E119 Type 2 diabetes mellitus without complications: Secondary | ICD-10-CM

## 2020-07-04 ENCOUNTER — Other Ambulatory Visit: Payer: Self-pay | Admitting: *Deleted

## 2020-07-04 DIAGNOSIS — E119 Type 2 diabetes mellitus without complications: Secondary | ICD-10-CM

## 2020-07-04 MED ORDER — GLIMEPIRIDE 2 MG PO TABS: 2.0000 mg | ORAL_TABLET | Freq: Every day | ORAL | 3 refills | Status: DC

## 2020-07-05 ENCOUNTER — Encounter: Payer: Self-pay | Admitting: Genetic Counselor

## 2020-07-05 DIAGNOSIS — Z1501 Genetic susceptibility to malignant neoplasm of breast: Secondary | ICD-10-CM

## 2020-07-05 DIAGNOSIS — Z1379 Encounter for other screening for genetic and chromosomal anomalies: Secondary | ICD-10-CM | POA: Insufficient documentation

## 2020-07-05 DIAGNOSIS — Z1589 Genetic susceptibility to other disease: Secondary | ICD-10-CM

## 2020-07-05 HISTORY — DX: Genetic susceptibility to malignant neoplasm of breast: Z15.01

## 2020-07-05 HISTORY — DX: Genetic susceptibility to other disease: Z15.89

## 2020-07-06 ENCOUNTER — Encounter: Payer: Self-pay | Admitting: *Deleted

## 2020-07-07 ENCOUNTER — Other Ambulatory Visit: Payer: Self-pay | Admitting: Family Medicine

## 2020-07-08 ENCOUNTER — Telehealth: Payer: Self-pay | Admitting: Genetic Counselor

## 2020-07-08 NOTE — Telephone Encounter (Signed)
Received a genetic counseling referral from Dr. Donne Hazel at [facility] for fhx of breast cancer. Ms. [name] has been cld and scheduled to see Santiago Glad on 10/18 at 1pm. Pt aware to arrive 15 minutes early.

## 2020-07-12 ENCOUNTER — Other Ambulatory Visit: Payer: Self-pay | Admitting: *Deleted

## 2020-07-12 MED ORDER — ATORVASTATIN CALCIUM 10 MG PO TABS
10.0000 mg | ORAL_TABLET | Freq: Every day | ORAL | 0 refills | Status: DC
Start: 2020-07-12 — End: 2020-10-04

## 2020-07-29 ENCOUNTER — Other Ambulatory Visit: Payer: Self-pay | Admitting: Family Medicine

## 2020-08-01 ENCOUNTER — Encounter: Payer: Self-pay | Admitting: Genetic Counselor

## 2020-08-01 ENCOUNTER — Inpatient Hospital Stay: Payer: 59

## 2020-08-01 ENCOUNTER — Inpatient Hospital Stay: Payer: 59 | Attending: Genetic Counselor | Admitting: Genetic Counselor

## 2020-08-01 ENCOUNTER — Other Ambulatory Visit: Payer: Self-pay

## 2020-08-01 DIAGNOSIS — Z803 Family history of malignant neoplasm of breast: Secondary | ICD-10-CM | POA: Diagnosis not present

## 2020-08-01 DIAGNOSIS — Z8041 Family history of malignant neoplasm of ovary: Secondary | ICD-10-CM

## 2020-08-01 DIAGNOSIS — Z1379 Encounter for other screening for genetic and chromosomal anomalies: Secondary | ICD-10-CM

## 2020-08-01 DIAGNOSIS — Z1501 Genetic susceptibility to malignant neoplasm of breast: Secondary | ICD-10-CM

## 2020-08-01 DIAGNOSIS — Z1589 Genetic susceptibility to other disease: Secondary | ICD-10-CM

## 2020-08-01 DIAGNOSIS — Z800 Family history of malignant neoplasm of digestive organs: Secondary | ICD-10-CM

## 2020-08-01 DIAGNOSIS — Z8051 Family history of malignant neoplasm of kidney: Secondary | ICD-10-CM

## 2020-08-01 NOTE — Progress Notes (Signed)
GENETIC TEST RESULTS   Patient Name: [name] Patient Age: 54 y.o. Encounter Date: 08/01/2020  Referring Provider: Rolm Bookbinder, MD  Additional Provider: Cheri Fowler, MD    [name] was seen in the [facility] clinic on August 01, 2020 due to a family history of cancer and concern regarding a hereditary predisposition to cancer in the family. Please refer to the prior Genetics clinic note for more information regarding [name]'s medical and family histories and our assessment at the time.   FAMILY HISTORY:  We obtained a detailed, 4-generation family history.  Significant diagnoses are listed below: Family History  Problem Relation Age of Onset  . Hypertension Mother   . Breast cancer Mother 90  . Polycythemia Father   . Pancreatic cancer Father   . Lung cancer Brother        smoker  . Breast cancer Maternal Aunt        dx over 63  . Ovarian cancer Maternal Aunt   . Colon cancer Maternal Uncle        dx over 39  . Cancer Paternal Aunt        Vulvar cancer  . Stroke Maternal Grandmother   . Prostate cancer Maternal Grandfather   . Pancreatic cancer Paternal Grandmother   . Polycythemia Brother   . Kidney cancer Paternal Aunt        d. late 30s-early 63s    The patient has one son who is cancer free.  She has three brothers, one died of lung cancer.  Both parents are deceased.  The patient's mother had breast cancer at 47.  She had one sister and two brothers.  One brother had colon cancer over 38 and her sister had breast and ovarian cancer in her 77s.  The maternal grandparnets are deceased.  The grandfather was reported to have prostate cancer.  The patient's father was thought to have pancreatic cancer when he died.  He had two sisters and a brother.  The brother died in a MVA, one sister died of kidney cancer in her late 30s-early 68s, and a sister had vulvar cancer.  The paternal grandmother is reported to have pancreatic  cancer.  Patient's maternal ancestors are of Caucasian descent, and paternal ancestors are of Caucasian descent. There is no reported Ashkenazi Jewish ancestry. There is no known consanguinity.  GENETIC TESTING:  [name] was identified as being at high risk for breast cancer by [facility].  She saw [name], who recommended genetic testing.  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 There were no deleterious mutations in APC, ATM, AXIN2, BARD1, BMPR1A, BRCA1, BRCA2, CDH1, CDK4, CDKN2A, CHEK2, CTNNA1, DICER1, EPCAM, GREM1, HOXB13, KIT, MEN1, MLH1, MSH2, MSH3, MSH6, MUTYH, NBN, NF1, NTHL1, PALB2, PDGFRA, PMS2, POLD1, POLE, PTEN, RAD50, RAD51C, RAD51D, SDHA, SDHB, SDHC, SDHD, SMAD4, SMARCA4. STK11, TP53, TSC1, TSC2, and VHL .     Based on the family history of early onset kidney cancer, we asked the laboratory, [facility], to increase the testing panel to 85 genes.  Up to 17% of kidney cancer can be inherited.  Unfortunately we do not know the pathology of the kidney cancer to narrow testing more specifically.  Per NCCN, individuals who are diagnosed with kidney cancer under age 59 should be offered genetic testing.  We will contact the patient once the results are back.  The patient has two generations of pancreatic cancer.  Specifically her paternal grandmother had pancreatic cancer and her father was suspected to have pancreatic cancer.  We discussed that two generations of pancreatic cancer is considered familial pancreatic cancer.  We can offer her pancreatic cancer screening through [place].  Screening is performed through EUS and MRI.  [name] is the provider who does this screening.  Lastly, we discussed the BRIP1 mutation identified in the patient.  Clinical condition Women who are carriers of a  single pathogenic BRIP1 variant have an increased risk of breast and ovarian cancer. The exact risk figures for breast cancer have yet to be determined; however, studies suggest the risk of ovarian cancer is approximately 8% and may be up to 15% (PMID: 96283662, 94765465, 03546568, 12751700). An individual with a BRIP1 pathogenic variant will not necessarily develop cancer in their lifetime, but the risk for cancer is increased over the general population risk.   Inheritance Hereditary predisposition to cancer due to a single pathogenic variant in the BRIP1 gene has autosomal dominant inheritance. This means that an individual with a pathogenic variant has a 50% chance of passing the condition on to their offspring. Once such a variant is detected in an individual, it is possible to identify at-risk relatives who can pursue testing for this specific familial variant. Many cases are inherited from a parent, but some cases can occur spontaneously (i.e., an individual with a pathogenic variant has parents who do not have it).   Additionally, individuals with a pathogenic variant in BRIP1 are carriers of Fanconi anemia type J. Fanconi anemia is an autosomal recessive disorder that is characterized by bone marrow failure and variable presentation of anomalies, including short stature, abnormal skin pigmentation, abnormal thumbs, malformations of the skeletal and central nervous systems, and developmental delay (PMID: 1749449, 67591638). Risks for leukemia and early onset solid tumors are significantly elevated (PMID: 46659935, 70177939, 03009233). For there to be a risk of Fanconi anemia in offspring, both parents would each have to have a single pathogenic variant in [place]; in such a case, the risk of having an affected child is 25%.  Management The [place] (NCCN) recommends consideration of prophylactic salpingo-oophorectomy (surgical removal of the ovaries and fallopian tubes)  for women with a pathogenic variant in BRIP1 after childbearing is complete. Based on the current evidence base, a discussion about surgery should be held around 42-69 years of age or earlier based on a specific family history of early-onset ovarian cancer ( Artist. Genetic/Familial High-Risk Assessment: Breast and Ovarian. Version 1.2022). Women electing to defer prophylactic oophorectomy can consider screening with" or "using serum CA-125 and transvaginal ultrasound; however, data do not support such screening and it should not be a substitute for preventive surgery. Ms. Southers sees Dr. Cheri Black for her GYN care.  Ms. Mclees will discuss this information with further with her provider.    The current NCCN guidelines do not recommend additional breast cancer screening for individuals with a single pathogenic BRIP1 variant beyond what is recommended for the general population. However, they caution that cancer screening should ultimately be guided by personal and family history ( Artist. Genetic/Familial High-Risk Assessment: Breast and Ovarian. Version 1.2022).  An individual's cancer risk and medical management are not determined by genetic test results alone. Overall cancer risk assessment incorporates additional factors, including personal medical history, family history, and any available genetic information that may result in a personalized plan  for cancer prevention and surveillance.  Knowing if a pathogenic variant in BRIP1 is present is advantageous. At-risk relatives can be identified, enabling pursuit of a diagnostic evaluation. Further, the available information regarding hereditary cancer susceptibility genes is constantly evolving and more clinically relevant data regarding BRIP1 are likely to become available in the near future. Awareness of this cancer predisposition encourages patients and their providers to inform at-risk  family members, to diligently follow screening protocols, and to be vigilant in maintaining close and regular contact with their local genetics clinic in anticipation of new information.  FAMILY MEMBERS: It is important that all of Ms. Hitchner's relatives (both men and women) know of the presence of this gene mutation. Site-specific genetic testing can sort out who in the family is at risk and who is not. It is advantageous to know if a BRIP1 pathogenic variant is present as medical management recommendations can be implemented. At-risk relatives can be identified, allowing pursuit of a diagnostic evaluation. In addition, the available information regarding hereditary cancer susceptibility genes is constantly evolving and more clinically relevant BRIP1 data is likely to become available in the near future. Awareness of this cancer predisposition allows patients and their providers to be vigilant in maintaining close and regular contact with their local genetics clinic in anticipation of new information, inform at-risk family members, and diligently follow condition-specific screening protocols.  Ms. Branum children and siblings have a 50% chance to have inherited this mutation. We recommend they have genetic testing for this same mutation, as identifying the presence of this mutation would allow them to also take advantage of risk-reducing measures.   SUPPORT AND RESOURCES: If Ms. Kommer is interested in BRIP1-specific information and support, one group, Facing Our Risk (www.facingourrisk.com) is one which some people have found useful. They provide opportunities to speak with other individuals from high-risk families. To locate genetic counselors in other cities, visit the website of the [employer] (ArtistMovie.se) and Secretary/administrator for a Social worker by zip code.  We encouraged Ms. Twombly to remain in contact with [place] on an annual basis so we can update her personal and family histories,  and let her know of advances in cancer genetics that may benefit the family. Our contact number was provided. Ms. Kaser questions were answered to her satisfaction today, and she knows she is welcome to call anytime with additional questions.   Simmone [place] [place], [place], [facility] Licensed, Insurance risk surveyor [email or web address] phone: [phone]  The patient was seen for a total of 45 minutes in face-to-face genetic counseling.

## 2020-08-03 ENCOUNTER — Other Ambulatory Visit: Payer: Self-pay

## 2020-08-03 MED ORDER — METFORMIN HCL 500 MG PO TABS: ORAL_TABLET | ORAL | 0 refills | Status: DC

## 2020-08-05 ENCOUNTER — Telehealth: Payer: Self-pay | Admitting: Genetic Counselor

## 2020-08-05 NOTE — Telephone Encounter (Signed)
error 

## 2020-08-05 NOTE — Telephone Encounter (Signed)
Revealed that the remainder of her genetic testing was normal.  The only variant on the testing is the BRIP1.

## 2020-08-12 ENCOUNTER — Ambulatory Visit: Payer: Self-pay | Admitting: Genetic Counselor

## 2020-08-12 DIAGNOSIS — Z1379 Encounter for other screening for genetic and chromosomal anomalies: Secondary | ICD-10-CM

## 2020-08-12 NOTE — Progress Notes (Signed)
HPI:  Ms. 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 Please refer to our prior cancer genetics clinic note for more information regarding our discussion, assessment and recommendations, at the time. At that time, we reflexed to a larger genetic test in order to include Melanoma genes that were not fully covered in her original testing.  This note is to reflect on the remainder of the genetic testing, and not the original BRIP1 pathogenic variant.  Please refer to the note dated August 01, 2020 for information about the BRIP1.  Ms. [name] recent genetic test results were disclosed to her, as were recommendations warranted by these results. These results and recommendations are discussed in more detail below.  CANCER HISTORY:  Oncology History   No history exists.    FAMILY HISTORY:  We obtained a detailed, 4-generation family history.  Significant diagnoses are listed below: Family History  Problem Relation Age of Onset   Hypertension Mother    Breast cancer Mother 52   Polycythemia Father    Pancreatic cancer Father    Lung cancer Brother        smoker   Breast cancer Maternal Aunt        dx over 63   Ovarian cancer Maternal Aunt    Colon cancer Maternal Uncle        dx over 70   Cancer Paternal Aunt        Vulvar cancer   Stroke Maternal Grandmother    Prostate cancer Maternal Grandfather    Pancreatic cancer Paternal Grandmother    Polycythemia Brother    Kidney cancer Paternal Aunt        d. late 30s-early 45s    The patient has one son who is cancer free.  She has three brothers, one died of lung cancer.  Both parents are deceased.  The patient's mother had breast cancer at 58.  She had one sister and two brothers.  One brother had colon cancer over 78 and her sister had breast  and ovarian cancer in her 70's.  The maternal grandparents are deceased.  The grandfather was reported to have prostate cancer.  The patient's father was thought to have pancreatic cancer when he died.  He had two sisters and a brother.  The brother died in a MVA, one sister died of kidney cancer in her late 62's-early 40's, and a sister had vulvar cancer.  The paternal grandmother is reported to have pancreatic cancer.  Patient's maternal ancestors are of Caucasian descent, and paternal ancestors are of Caucasian descent. There is no reported Ashkenazi Jewish ancestry. There is no known consanguinity.  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  This was discussed previously with Ms. [name] at her August 01, 2020 appointment. The remainder of her genetic testing was negative. The Multi-Gene Panel offered by [facility] includes sequencing and/or deletion duplication testing of the following 85 genes: AIP, ALK, APC, ATM, AXIN2,BAP1,  BARD1, BLM, BMPR1A, BRCA1, BRCA2, BRIP1, CASR, CDC73, CDH1, CDK4, CDKN1B, CDKN1C, CDKN2A (p14ARF), CDKN2A (p16INK4a), CEBPA, CHEK2, CTNNA1, DICER1, DIS3L2, EGFR (c.2369C>T, p.Thr790Met variant only), EPCAM (Deletion/duplication testing only), FH, FLCN, GATA2, GPC3, GREM1 (Promoter region deletion/duplication testing only), HOXB13 (c.251G>A, p.Gly84Glu), HRAS, KIT, MAX, MEN1, MET, MITF (c.952G>A, p.Glu318Lys variant only), MLH1, MSH2,  MSH3, MSH6, MUTYH, NBN, NF1, NF2, NTHL1, PALB2, PDGFRA, PHOX2B, PMS2, POLD1, POLE, POT1, PRKAR1A, PTCH1, PTEN, RAD50, RAD51C, RAD51D, RB1, RECQL4, RET, RNF43, RUNX1, SDHAF2, SDHA (sequence changes only), SDHB, SDHC, SDHD, SMAD4, SMARCA4, SMARCB1, SMARCE1, STK11, SUFU, TERC, TERT, TMEM127, TP53, TSC1, TSC2, VHL, WRN and WT1. The test report has been scanned into EPIC and is located under the Molecular Pathology section of the Results Review tab.  A portion  of the result report is included below for reference.     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  We discussed with Ms. [name] that because current genetic testing is not perfect, it is possible there may be a gene mutation in one of these additional genes that current testing cannot detect, but that chance is small.  We also discussed, that there could be another gene that has not yet been discovered, or that we have not yet tested, that is responsible for the cancer diagnoses in the family. It is also possible there is a hereditary cause for the cancer in the family that Ms. [name] did not inherit and therefore was not identified in her testing.  Therefore, it is important to remain in touch with cancer genetics in the future so that we can continue to offer Ms. [name] the most up to date genetic testing.   CANCER SCREENING RECOMMENDATIONS: Please see Genetic Counseling note dated August 01, 2020.  RECOMMENDATIONS FOR FAMILY MEMBERS:  It is important that all of Ms. [name] relatives (both men and women) know of the presence of this BRIP1 gene mutation. Site-specific genetic testing can sort out who in the family is at risk and who is not. It is advantageous to know if a BRIP1 pathogenic variant is present as medical management recommendations can be implemented. At-risk relatives can be identified, allowing pursuit of a diagnostic evaluation. In addition, the available information regarding hereditary cancer susceptibility genes is constantly evolving and more clinically relevant BRIP1 data is likely to become available in the near future. Awareness of this cancer predisposition allows patients and their providers to be vigilant in maintaining close and regular contact with their local genetics clinic in anticipation of new information, inform at-risk family members, and diligently follow condition-specific screening  protocols.  Ms. [name] children and siblings have a 50% chance to have inherited this BRIP1 mutation. We recommend they have genetic testing for this same mutation, as identifying the presence of this mutation would allow them to also take advantage of risk-reducing measures.   Our contact number was provided. Ms. [name] questions were answered to her satisfaction, and she knows she is welcome to call us at anytime with additional questions or concerns.   Roma Kayser, [place], [facility] Licensed, Certified Genetic Counselor [email or web address]

## 2020-09-01 ENCOUNTER — Other Ambulatory Visit: Payer: Self-pay | Admitting: Family Medicine

## 2020-09-03 LAB — COMPREHENSIVE METABOLIC PANEL
ALT: 21 IU/L (ref 0–32)
AST: 19 IU/L (ref 0–40)
Albumin/Globulin Ratio: 2 (ref 1.2–2.2)
Albumin: 4.6 g/dL (ref 3.8–4.9)
Alkaline Phosphatase: 75 IU/L (ref 44–121)
BUN/Creatinine Ratio: 26 — ABNORMAL HIGH (ref 9–23)
BUN: 17 mg/dL (ref 6–24)
Bilirubin Total: 0.6 mg/dL (ref 0.0–1.2)
CO2: 27 mmol/L (ref 20–29)
Calcium: 9.5 mg/dL (ref 8.7–10.2)
Chloride: 102 mmol/L (ref 96–106)
Creatinine, Ser: 0.66 mg/dL (ref 0.57–1.00)
GFR calc Af Amer: 114 mL/min/{1.73_m2} (ref >59)
GFR calc non Af Amer: 99 mL/min/{1.73_m2} (ref >59)
Globulin, Total: 2.3 g/dL (ref 1.5–4.5)
Glucose: 162 mg/dL — ABNORMAL HIGH (ref 65–99)
Potassium: 4 mmol/L (ref 3.5–5.2)
Sodium: 142 mmol/L (ref 134–144)
Total Protein: 6.9 g/dL (ref 6.0–8.5)

## 2020-09-03 LAB — CBC
Hematocrit: 42.1 % (ref 34.0–46.6)
Hemoglobin: 13.6 g/dL (ref 11.1–15.9)
MCH: 26.4 pg — ABNORMAL LOW (ref 26.6–33.0)
MCHC: 32.3 g/dL (ref 31.5–35.7)
MCV: 82 fL (ref 79–97)
Platelets: 224 10*3/uL (ref 150–450)
RBC: 5.15 x10E6/uL (ref 3.77–5.28)
RDW: 14.2 % (ref 11.7–15.4)
WBC: 6 10*3/uL (ref 3.4–10.8)

## 2020-09-03 LAB — LIPID PANEL
Chol/HDL Ratio: 2.9 ratio (ref 0.0–4.4)
Cholesterol, Total: 140 mg/dL (ref 100–199)
HDL: 48 mg/dL (ref >39)
LDL Chol Calc (NIH): 68 mg/dL (ref 0–99)
Triglycerides: 136 mg/dL (ref 0–149)
VLDL Cholesterol Cal: 24 mg/dL (ref 5–40)

## 2020-09-03 LAB — VITAMIN D 25 HYDROXY (VIT D DEFICIENCY, FRACTURES): Vit D, 25-Hydroxy: 40.1 ng/mL (ref 30.0–100.0)

## 2020-09-03 LAB — TSH: TSH: 2 u[IU]/mL (ref 0.450–4.500)

## 2020-09-03 LAB — HEMOGLOBIN A1C
Est. average glucose Bld gHb Est-mCnc: 154 mg/dL
Hgb A1c MFr Bld: 7 % — ABNORMAL HIGH (ref 4.8–5.6)

## 2020-09-07 ENCOUNTER — Ambulatory Visit: Payer: 59 | Admitting: Family Medicine

## 2020-09-07 ENCOUNTER — Other Ambulatory Visit: Payer: Self-pay

## 2020-09-07 ENCOUNTER — Encounter: Payer: Self-pay | Admitting: Family Medicine

## 2020-09-07 VITALS — BP 142/80 | HR 104 | Temp 97.8°F | Ht 66.0 in | Wt 245.0 lb

## 2020-09-07 DIAGNOSIS — E119 Type 2 diabetes mellitus without complications: Secondary | ICD-10-CM

## 2020-09-07 DIAGNOSIS — I10 Essential (primary) hypertension: Secondary | ICD-10-CM | POA: Diagnosis not present

## 2020-09-07 DIAGNOSIS — Z6839 Body mass index (BMI) 39.0-39.9, adult: Secondary | ICD-10-CM | POA: Diagnosis not present

## 2020-09-07 NOTE — Assessment & Plan Note (Signed)
[name] is encouraged to maintain a well balanced diet that is low in salt. Controlled, continue current medication regimen. Additionally, she is also reminded that exercise is beneficial for heart health and control of  Blood pressure. 30-60 minutes daily is recommended-walking was suggested.

## 2020-09-07 NOTE — Assessment & Plan Note (Addendum)
Well-controlled, A1c 7%.    [name] is encouraged to check blood sugar daily as directed. Continue current medications. Is on statin as well. Educated on importance of maintain a well balanced diabetic friendly diet.  She is reminded the importance of maintaining  good blood sugars,  taking medications as directed, daily foot care, annual eye exams. Additionally educated about keeping good control over blood pressure and cholesterol as well.

## 2020-09-07 NOTE — Patient Instructions (Signed)
  HAPPY FALL!  I appreciate the opportunity to provide you with care for your health and wellness. Today we discussed: recent labs  Follow up: Aug 2022 for CPE (after 8/26) fasting labs  No labs or referrals today  Call if you need anything before your next appt!  Otherwise, have a great [place] Season!   Please continue to practice social distancing to keep you, your family, and our community safe.  If you must go out, please wear a mask and practice good handwashing.  It was a pleasure to see you and I look forward to continuing to work together on your health and well-being. Please do not hesitate to call the office if you need care or have questions about your care.  Have a wonderful day and week. With Gratitude, Cherly Beach, DNP, AGNP-BC

## 2020-09-07 NOTE — Assessment & Plan Note (Signed)
Improved  [name] is re-educated about the importance of exercise daily to help with weight management. A minumum of 30 minutes daily is recommended. Additionally, importance of healthy food choices  with portion control discussed.  Wt Readings from Last 3 Encounters:  09/07/20 245 lb (111.1 kg)  06/13/20 252 lb 9.6 oz (114.6 kg)  06/09/20 253 lb 6.4 oz (114.9 kg)

## 2020-09-07 NOTE — Progress Notes (Signed)
Subjective:  Patient ID: [name], female    DOB: 1966-06-30  Age: 54 y.o. MRN: [id number]  CC:  Chief Complaint  Patient presents with  . Follow-up      HPI  HPI [name] is a 54 year old female patient who presents today for follow-up on recent lab work. She overall had a very good labs.  A1c was 7%.  I have educated her on keeping it 6.5% to 7%.  She reports that she has been doing well on her diet not having any signs or symptoms of hyper or hypoglycemia.  She denies having any other issues or concerns today.  But does mention that secondary to her genetic testing her ovaries will be removed in the earlier part of 2022 secondary to having a greater than 12% chance of developing ovarian cancer of which she lost an aunt to.  Her mother and grandmother also had breast cancer however that was not seen as a mutation in her genetic profile.  She reports being kind of iffy on this but feels okay about proceeding with it.  It will be an same-day procedure.  She denies having any chest pain, cough, shortness of breath, leg swelling, palpitations, headaches or dizziness.  Today patient denies signs and symptoms of COVID 19 infection including fever, chills, cough, shortness of breath, and headache. Past Medical, Surgical, Social History, Allergies, and Medications have been Reviewed.   Past Medical History:  Diagnosis Date  . Allergy   . Asthma   . Asthma    Phreesia 03/27/2020  . Breast calcification, left 02/25/2012  . Depression 11/12/2019  . Diabetes mellitus without complication ([place])   . Family history of breast cancer   . Family history of colon cancer   . Family history of kidney cancer   . Family history of ovarian cancer   . Family history of pancreatic cancer   . Female stress incontinence 06/09/2020  . Headache(784.0)    migraines occ  . Hypertension   . Menopausal symptom 06/09/2020  . Mutation in [place] gene 07/05/2020  . Obesity 03/30/2020  . Recurrent  upper respiratory infection (URI)    z pak finished 03/11/12    Current Meds  Medication Sig  . albuterol (PROVENTIL HFA;VENTOLIN HFA) 108 (90 BASE) MCG/ACT inhaler Inhale 1 puff into the lungs every 6 (six) hours as needed. As needed for asthma.  Marland Kitchen atorvastatin (LIPITOR) 10 MG tablet Take 1 tablet (10 mg total) by mouth daily.  . cetirizine (ZYRTEC) 10 MG tablet Take 10 mg by mouth daily.  Marland Kitchen ESCITALOPRAM OXALATE PO Take 10 mg by mouth daily.  Marland Kitchen glimepiride (AMARYL) 2 MG tablet Take 1 tablet (2 mg total) by mouth daily before breakfast.  . losartan-hydrochlorothiazide (HYZAAR) 50-12.5 MG tablet Take 1 tablet by mouth daily.  . metFORMIN (GLUCOPHAGE) 500 MG tablet TAKE TWO (2) TABLETS BY MOUTH 2 TIMES DAILY  . Omega-3 Fatty Acids (FISH OIL) 1200 MG CAPS Take 1 capsule by mouth daily.  Marland Kitchen OVER THE COUNTER MEDICATION Take 2 tablets by mouth daily. Womens Mutilvitamin  . SUMAtriptan (IMITREX) 100 MG tablet Take 1 tablet (100 mg total) by mouth every 2 (two) hours as needed.    ROS:  Review of Systems  Constitutional: Negative.   HENT: Negative.   Eyes: Negative.   Respiratory: Negative.   Cardiovascular: Negative.   Gastrointestinal: Negative.   Genitourinary: Negative.   Musculoskeletal: Negative.   Skin: Negative.   Neurological: Negative.   Endo/Heme/Allergies: Negative.  Psychiatric/Behavioral: Negative.      Objective:   Today's Vitals: BP (!) 142/80 (BP Location: Right Arm, Patient Position: Sitting, Cuff Size: Normal)   Pulse (!) 104   Temp 97.8 F (36.6 C) (Temporal)   Ht $R'5\' 6"'DH$  (1.676 m)   Wt 245 lb (111.1 kg)   SpO2 96%   BMI 39.54 kg/m  Vitals with BMI 09/07/2020 06/13/2020 06/09/2020  Height $Remov'5\' 6"'vgjuKu$  $Remove'5\' 5"'HCKjgZG$  $RemoveB'5\' 6"'epLPMtZl$   Weight 245 lbs 252 lbs 10 oz 253 lbs 6 oz  BMI 39.56 98.92 11.94  Systolic 174 - 081  Diastolic 80 - 84  Pulse 448 - 86     Physical Exam Vitals and nursing note reviewed.  Constitutional:      Appearance: Normal appearance. She is well-developed  and well-groomed. She is obese.  HENT:     Head: Normocephalic and atraumatic.     Right Ear: External ear normal.     Left Ear: External ear normal.     Mouth/Throat:     Comments: Mask in place Eyes:     General:        Right eye: No discharge.        Left eye: No discharge.     Conjunctiva/sclera: Conjunctivae normal.  Cardiovascular:     Rate and Rhythm: Normal rate and regular rhythm.     Pulses: Normal pulses.     Heart sounds: Normal heart sounds.  Pulmonary:     Effort: Pulmonary effort is normal.     Breath sounds: Normal breath sounds.  Musculoskeletal:        General: Normal range of motion.     Cervical back: Normal range of motion and neck supple.  Skin:    General: Skin is warm.  Neurological:     General: No focal deficit present.     Mental Status: She is alert and oriented to person, place, and time.  Psychiatric:        Attention and Perception: Attention normal.        Mood and Affect: Mood normal.        Speech: Speech normal.        Behavior: Behavior normal. Behavior is cooperative.        Thought Content: Thought content normal.        Cognition and Memory: Cognition normal.        Judgment: Judgment normal.    Assessment   1. Diabetes mellitus without complication (HCC)   2. Class 2 severe obesity due to excess calories with serious comorbidity and body mass index (BMI) of 39.0 to 39.9 in adult ([place])   3. Essential hypertension     Tests ordered No orders of the defined types were placed in this encounter.    Plan: Please see assessment and plan per problem list above.   No orders of the defined types were placed in this encounter.   Patient to follow-up in Aug 2022 for CPE   Note: This dictation was prepared with Dragon dictation along with smaller phrase technology. Similar sounding words can be transcribed inadequately or may not be corrected upon review. Any transcriptional errors that result from this process are unintentional.       Perlie Mayo, NP

## 2020-09-15 ENCOUNTER — Ambulatory Visit: Payer: 59 | Admitting: Nutrition

## 2020-09-15 ENCOUNTER — Telehealth: Payer: Self-pay | Admitting: Nutrition

## 2020-09-15 NOTE — Telephone Encounter (Signed)
vm to to call and reschedule missed appt.

## 2020-09-26 ENCOUNTER — Other Ambulatory Visit: Payer: Self-pay | Admitting: Family Medicine

## 2020-09-26 DIAGNOSIS — E119 Type 2 diabetes mellitus without complications: Secondary | ICD-10-CM

## 2020-09-30 NOTE — Telephone Encounter (Signed)
I am not sure what is going on with the prescriptions when she was in the office I said 90 day supply is good to have. I only do 30 days on new med starts. So I am not sure what is going on.

## 2020-09-30 NOTE — Telephone Encounter (Signed)
Atorvastin and Metformin were not refilled at the pharmacy, she wants to know why she has this problem every month and why 3 month supplies cannot be called in.

## 2020-10-04 ENCOUNTER — Other Ambulatory Visit: Payer: Self-pay

## 2020-10-04 MED ORDER — ATORVASTATIN CALCIUM 10 MG PO TABS
10.0000 mg | ORAL_TABLET | Freq: Every day | ORAL | 0 refills | Status: DC
Start: 2020-10-04 — End: 2020-11-15

## 2020-10-28 ENCOUNTER — Other Ambulatory Visit: Payer: Self-pay

## 2020-10-28 DIAGNOSIS — E119 Type 2 diabetes mellitus without complications: Secondary | ICD-10-CM

## 2020-10-31 ENCOUNTER — Other Ambulatory Visit: Payer: Self-pay | Admitting: Family Medicine

## 2020-10-31 DIAGNOSIS — E119 Type 2 diabetes mellitus without complications: Secondary | ICD-10-CM

## 2020-11-15 ENCOUNTER — Other Ambulatory Visit: Payer: Self-pay | Admitting: Family Medicine

## 2020-11-15 DIAGNOSIS — E119 Type 2 diabetes mellitus without complications: Secondary | ICD-10-CM

## 2020-12-19 ENCOUNTER — Other Ambulatory Visit: Payer: Self-pay

## 2020-12-19 ENCOUNTER — Encounter (HOSPITAL_BASED_OUTPATIENT_CLINIC_OR_DEPARTMENT_OTHER): Payer: Self-pay | Admitting: Obstetrics and Gynecology

## 2020-12-19 NOTE — Progress Notes (Signed)
Spoke w/ via phone for pre-op interview---pt Lab needs dos----   none            Lab results------has lab att 12-20-2020 1315 for cbc cmp ekg COVID test ------12-20-2020 1205 Arrive at -------730 am 12-23-2020 NPO after MN NO Solid Food.  Clear liquids from MN until---630 am then npo Medications to take morning of surgery -----atorvastatin, albuterol inhaler pnr/bring inhaler, certrizine, nexium Diabetic medication ----none day of surgery- Patient Special Instructions -----none Pre-Op special Istructions -----none Patient verbalized understanding of instructions that were given at this phone interview. Patient denies shortness of breath, chest pain, fever, cough at this phone interview.

## 2020-12-20 ENCOUNTER — Encounter (HOSPITAL_COMMUNITY)
Admission: RE | Admit: 2020-12-20 | Discharge: 2020-12-20 | Disposition: A | Discharge status: Home / Self Care | Payer: 59 | Source: Ambulatory Visit | Attending: Obstetrics and Gynecology | Admitting: Obstetrics and Gynecology

## 2020-12-20 ENCOUNTER — Other Ambulatory Visit (HOSPITAL_COMMUNITY)
Admission: RE | Admit: 2020-12-20 | Discharge: 2020-12-20 | Disposition: A | Discharge status: Home / Self Care | Payer: 59 | Source: Ambulatory Visit | Attending: Obstetrics and Gynecology | Admitting: Obstetrics and Gynecology

## 2020-12-20 DIAGNOSIS — Z01818 Encounter for other preprocedural examination: Secondary | ICD-10-CM | POA: Insufficient documentation

## 2020-12-20 DIAGNOSIS — Z20822 Contact with and (suspected) exposure to covid-19: Secondary | ICD-10-CM | POA: Diagnosis not present

## 2020-12-20 LAB — CBC
HCT: 44.2 % (ref 36.0–46.0)
Hemoglobin: 13.6 g/dL (ref 12.0–15.0)
MCH: 26.1 pg (ref 26.0–34.0)
MCHC: 30.8 g/dL (ref 30.0–36.0)
MCV: 84.8 fL (ref 80.0–100.0)
Platelets: 222 10*3/uL (ref 150–400)
RBC: 5.21 MIL/uL — ABNORMAL HIGH (ref 3.87–5.11)
RDW: 14.7 % (ref 11.5–15.5)
WBC: 6.4 10*3/uL (ref 4.0–10.5)
nRBC: 0 % (ref 0.0–0.2)

## 2020-12-20 LAB — COMPREHENSIVE METABOLIC PANEL
ALT: 23 U/L (ref 0–44)
AST: 23 U/L (ref 15–41)
Albumin: 4.5 g/dL (ref 3.5–5.0)
Alkaline Phosphatase: 71 U/L (ref 38–126)
Anion gap: 8 (ref 5–15)
BUN: 25 mg/dL — ABNORMAL HIGH (ref 6–20)
CO2: 30 mmol/L (ref 22–32)
Calcium: 10.3 mg/dL (ref 8.9–10.3)
Chloride: 101 mmol/L (ref 98–111)
Creatinine, Ser: 0.66 mg/dL (ref 0.44–1.00)
GFR, Estimated: >60 mL/min (ref >60)
Glucose, Bld: 147 mg/dL — ABNORMAL HIGH (ref 70–99)
Potassium: 4.7 mmol/L (ref 3.5–5.1)
Sodium: 139 mmol/L (ref 135–145)
Total Bilirubin: 0.7 mg/dL (ref 0.3–1.2)
Total Protein: 7.9 g/dL (ref 6.5–8.1)

## 2020-12-20 LAB — SARS CORONAVIRUS 2 (TAT 6-24 HRS): SARS Coronavirus 2: NEGATIVE

## 2020-12-22 NOTE — H&P (Signed)
[name] is an 55 y.o. female. She has FH breast and ovarian cancer, she had genetic testing and is positive for BRIP1 gene which elevates her risk of ovarian cancer enough that she wants her ovaries removed.  Previous abdominal supracervical hysterectomy.  Pertinent Gynecological History: Last mammogram: normal Date: recent Last pap: normal Date: 11/2020 OB History: G1, P1001   Menstrual History: No LMP recorded. Patient has had a hysterectomy.    Past Medical History:  Diagnosis Date  . Allergy   . Asthma   . Asthma    Phreesia 03/27/2020  . Breast calcification, left 02/25/2012  . Controlled type 2 diabetes mellitus without complication ([place])   . Depression 11/12/2019  . Family history of breast cancer   . Family history of colon cancer   . Family history of kidney cancer   . Family history of ovarian cancer   . Family history of pancreatic cancer   . Female stress incontinence 06/09/2020  . GERD (gastroesophageal reflux disease)   . Headache(784.0)    migraines occ  . Hypertension   . Menopausal symptom 06/09/2020  . Mutation in [place] gene 07/05/2020  . Obesity 03/30/2020  . Wears glasses     Past Surgical History:  Procedure Laterality Date  . ABDOMINAL HYSTERECTOMY  several yrs ago   partial  . BREAST SURGERY  yrs ago   biopsy   . DILATION AND CURETTAGE OF UTERUS    . NASAL SINUS SURGERY  2006   deviated septum    Family History  Problem Relation Age of Onset  . Hypertension Mother   . Breast cancer Mother 59  . Polycythemia Father   . Pancreatic cancer Father   . Lung cancer Brother        smoker  . Breast cancer Maternal Aunt        dx over 54  . Ovarian cancer Maternal Aunt   . Colon cancer Maternal Uncle        dx over 85  . Cancer Paternal Aunt        Vulvar cancer  . Stroke Maternal Grandmother   . Prostate cancer Maternal Grandfather   . Pancreatic cancer Paternal Grandmother   . Polycythemia Brother   . Kidney cancer Paternal Aunt         d. late 30s-early 43s    Social History:  reports that she has never smoked. She has never used smokeless tobacco. She reports that she does not drink alcohol and does not use drugs.  Allergies:  Allergies  Allergen Reactions  . Other Hives and Itching  . Cefoxitin Hives and Itching    No medications prior to admission.    Review of Systems  Respiratory: Negative.   Cardiovascular: Negative.     Height _0  (1.676 m), weight 108 kg. Physical Exam Vitals reviewed.  Constitutional:      Appearance: Normal appearance.  Cardiovascular:     Rate and Rhythm: Normal rate and regular rhythm.     Heart sounds: Normal heart sounds. No murmur heard.   Pulmonary:     Effort: Pulmonary effort is normal. No respiratory distress.     Breath sounds: Normal breath sounds. No wheezing.  Abdominal:     General: There is no distension.     Palpations: Abdomen is soft. There is no mass.     Tenderness: There is no abdominal tenderness.  Genitourinary:    Comments: Normal cervix No adnexal mass Musculoskeletal:     Cervical back:  Normal range of motion and neck supple.  Neurological:     Mental Status: She is alert.     No results found for this or any previous visit (from the past 24 hour(s)).  No results found.  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  She desires BSO, previously had abdominal supracervical hysterectomy.  Surgical procedure, risks, alternatives, chances of preventing ovarian cancer all reviewed, questions answered.  Will admit for laparoscopic BSO  [name] 12/22/2020, 8:18 PM

## 2020-12-22 NOTE — Anesthesia Preprocedure Evaluation (Addendum)
Anesthesia Evaluation  Patient identified by MRN, date of birth, ID band Patient awake    Reviewed: Allergy & Precautions, NPO status , Patient's Chart, lab work & pertinent test results  History of Anesthesia Complications Negative for: history of anesthetic complications  Airway Mallampati: II  TM Distance: >3 FB Neck ROM: Full    Dental  (+) Poor Dentition, Chipped, Dental Advisory Given   Pulmonary asthma , COPD (last used inhalers 4-5 days ago),  COPD inhaler,  12/20/2020 SARS coronavirus NEG   breath sounds clear to auscultation       Cardiovascular hypertension, Pt. on medications (-) angina Rhythm:Regular Rate:Normal     Neuro/Psych  Headaches,    GI/Hepatic Neg liver ROS, GERD  Medicated and Controlled,  Endo/Other  diabetes, Oral Hypoglycemic AgentsMorbid obesity  Renal/GU negative Renal ROS     Musculoskeletal   Abdominal (+) + obese,   Peds  Hematology negative hematology ROS (+)   Anesthesia Other Findings   Reproductive/Obstetrics                            Anesthesia Physical Anesthesia Plan  ASA: III  Anesthesia Plan: General   Post-op Pain Management:    Induction: Intravenous  PONV Risk Score and Plan: Ondansetron, Dexamethasone and Scopolamine patch - Pre-op  Airway Management Planned: Oral ETT  Additional Equipment: None  Intra-op Plan:   Post-operative Plan: Extubation in OR  Informed Consent: I have reviewed the patients History and Physical, chart, labs and discussed the procedure including the risks, benefits and alternatives for the proposed anesthesia with the patient or authorized representative who has indicated his/her understanding and acceptance.     Dental advisory given  Plan Discussed with: CRNA and Surgeon  Anesthesia Plan Comments:        Anesthesia Quick Evaluation

## 2020-12-23 ENCOUNTER — Ambulatory Visit (HOSPITAL_BASED_OUTPATIENT_CLINIC_OR_DEPARTMENT_OTHER): Payer: 59 | Admitting: Anesthesiology

## 2020-12-23 ENCOUNTER — Ambulatory Visit (HOSPITAL_BASED_OUTPATIENT_CLINIC_OR_DEPARTMENT_OTHER)
Admission: RE | Admit: 2020-12-23 | Discharge: 2020-12-23 | Disposition: A | Discharge status: Home / Self Care | Payer: 59 | Attending: Obstetrics and Gynecology | Admitting: Obstetrics and Gynecology

## 2020-12-23 ENCOUNTER — Other Ambulatory Visit: Payer: Self-pay

## 2020-12-23 ENCOUNTER — Encounter (HOSPITAL_BASED_OUTPATIENT_CLINIC_OR_DEPARTMENT_OTHER): Payer: Self-pay | Admitting: Obstetrics and Gynecology

## 2020-12-23 ENCOUNTER — Encounter (HOSPITAL_BASED_OUTPATIENT_CLINIC_OR_DEPARTMENT_OTHER)
Admission: RE | Disposition: A | Discharge status: Home / Self Care | Payer: Self-pay | Source: Home / Self Care | Attending: Obstetrics and Gynecology

## 2020-12-23 DIAGNOSIS — D270 Benign neoplasm of right ovary: Secondary | ICD-10-CM | POA: Insufficient documentation

## 2020-12-23 DIAGNOSIS — Z1502 Genetic susceptibility to malignant neoplasm of ovary: Secondary | ICD-10-CM | POA: Insufficient documentation

## 2020-12-23 DIAGNOSIS — Z8041 Family history of malignant neoplasm of ovary: Secondary | ICD-10-CM | POA: Insufficient documentation

## 2020-12-23 DIAGNOSIS — Z90711 Acquired absence of uterus with remaining cervical stump: Secondary | ICD-10-CM | POA: Insufficient documentation

## 2020-12-23 DIAGNOSIS — D271 Benign neoplasm of left ovary: Secondary | ICD-10-CM | POA: Insufficient documentation

## 2020-12-23 DIAGNOSIS — N736 Female pelvic peritoneal adhesions (postinfective): Secondary | ICD-10-CM | POA: Insufficient documentation

## 2020-12-23 HISTORY — DX: Presence of spectacles and contact lenses: Z97.3

## 2020-12-23 HISTORY — DX: Gastro-esophageal reflux disease without esophagitis: K21.9

## 2020-12-23 HISTORY — PX: LAPAROSCOPIC BILATERAL SALPINGO OOPHERECTOMY: SHX5890

## 2020-12-23 HISTORY — DX: Type 2 diabetes mellitus without complications: E11.9

## 2020-12-23 LAB — GLUCOSE, CAPILLARY
Glucose-Capillary: 125 mg/dL — ABNORMAL HIGH (ref 70–99)
Glucose-Capillary: 175 mg/dL — ABNORMAL HIGH (ref 70–99)

## 2020-12-23 SURGERY — SALPINGO-OOPHORECTOMY, BILATERAL, LAPAROSCOPIC
Anesthesia: General | Site: Abdomen | Laterality: Bilateral

## 2020-12-23 MED ORDER — MIDAZOLAM HCL 2 MG/2ML IJ SOLN: 0.5000 mg | Freq: Once | INTRAMUSCULAR | Status: DC | PRN

## 2020-12-23 MED ORDER — ACETAMINOPHEN 500 MG PO TABS
1000.0000 mg | ORAL_TABLET | Freq: Once | ORAL | Status: AC
  Administered 2020-12-23: 1000 mg via ORAL

## 2020-12-23 MED ORDER — WHITE PETROLATUM EX OINT
TOPICAL_OINTMENT | CUTANEOUS | Status: AC
  Filled 2020-12-23: qty 5

## 2020-12-23 MED ORDER — SUGAMMADEX SODIUM 200 MG/2ML IV SOLN
INTRAVENOUS | Status: DC | PRN
  Administered 2020-12-23: 200 mg via INTRAVENOUS

## 2020-12-23 MED ORDER — FENTANYL CITRATE (PF) 250 MCG/5ML IJ SOLN
INTRAMUSCULAR | Status: AC
  Filled 2020-12-23: qty 5

## 2020-12-23 MED ORDER — DEXAMETHASONE SODIUM PHOSPHATE 10 MG/ML IJ SOLN
INTRAMUSCULAR | Status: DC | PRN
  Administered 2020-12-23: 5 mg via INTRAVENOUS

## 2020-12-23 MED ORDER — PROPOFOL 10 MG/ML IV BOLUS
INTRAVENOUS | Status: AC
  Filled 2020-12-23: qty 20

## 2020-12-23 MED ORDER — KETOROLAC TROMETHAMINE 30 MG/ML IJ SOLN
INTRAMUSCULAR | Status: AC
  Filled 2020-12-23: qty 1

## 2020-12-23 MED ORDER — SCOPOLAMINE 1 MG/3DAYS TD PT72
MEDICATED_PATCH | TRANSDERMAL | Status: AC
  Filled 2020-12-23: qty 1

## 2020-12-23 MED ORDER — MIDAZOLAM HCL 2 MG/2ML IJ SOLN
INTRAMUSCULAR | Status: AC
  Filled 2020-12-23: qty 2

## 2020-12-23 MED ORDER — PROPOFOL 10 MG/ML IV BOLUS
INTRAVENOUS | Status: DC | PRN
  Administered 2020-12-23: 200 mg via INTRAVENOUS

## 2020-12-23 MED ORDER — HYDROMORPHONE HCL 2 MG/ML IJ SOLN
INTRAMUSCULAR | Status: AC
  Filled 2020-12-23: qty 1

## 2020-12-23 MED ORDER — MIDAZOLAM HCL 5 MG/5ML IJ SOLN
INTRAMUSCULAR | Status: DC | PRN
  Administered 2020-12-23: 2 mg via INTRAVENOUS

## 2020-12-23 MED ORDER — BUPIVACAINE HCL (PF) 0.25 % IJ SOLN
INTRAMUSCULAR | Status: DC | PRN
  Administered 2020-12-23: 19 mL

## 2020-12-23 MED ORDER — ONDANSETRON HCL 4 MG/2ML IJ SOLN
INTRAMUSCULAR | Status: DC | PRN
  Administered 2020-12-23: 4 mg via INTRAVENOUS

## 2020-12-23 MED ORDER — ACETAMINOPHEN 500 MG PO TABS
ORAL_TABLET | ORAL | Status: AC
  Filled 2020-12-23: qty 2

## 2020-12-23 MED ORDER — LIDOCAINE 2% (20 MG/ML) 5 ML SYRINGE
INTRAMUSCULAR | Status: DC | PRN
  Administered 2020-12-23: 40 mg via INTRAVENOUS

## 2020-12-23 MED ORDER — LACTATED RINGERS IV SOLN: INTRAVENOUS | Status: DC

## 2020-12-23 MED ORDER — ROCURONIUM BROMIDE 10 MG/ML (PF) SYRINGE
PREFILLED_SYRINGE | INTRAVENOUS | Status: DC | PRN
  Administered 2020-12-23: 10 mg via INTRAVENOUS
  Administered 2020-12-23: 50 mg via INTRAVENOUS
  Administered 2020-12-23: 10 mg via INTRAVENOUS

## 2020-12-23 MED ORDER — ONDANSETRON HCL 4 MG/2ML IJ SOLN
INTRAMUSCULAR | Status: AC
  Filled 2020-12-23: qty 2

## 2020-12-23 MED ORDER — SODIUM CHLORIDE 0.9 % IR SOLN
Status: DC | PRN
  Administered 2020-12-23: 100 mL

## 2020-12-23 MED ORDER — HYDROMORPHONE HCL 1 MG/ML IJ SOLN
INTRAMUSCULAR | Status: DC | PRN
  Administered 2020-12-23: 1 mg via INTRAVENOUS

## 2020-12-23 MED ORDER — LABETALOL HCL 5 MG/ML IV SOLN: 5.0000 mg | INTRAVENOUS | Status: DC | PRN

## 2020-12-23 MED ORDER — HYDROMORPHONE HCL 1 MG/ML IJ SOLN: 0.2500 mg | INTRAMUSCULAR | Status: DC | PRN

## 2020-12-23 MED ORDER — ROCURONIUM BROMIDE 10 MG/ML (PF) SYRINGE
PREFILLED_SYRINGE | INTRAVENOUS | Status: AC
  Filled 2020-12-23: qty 10

## 2020-12-23 MED ORDER — HYDROCODONE-ACETAMINOPHEN 5-325 MG PO TABS: 1.0000 | ORAL_TABLET | ORAL | 0 refills | Status: DC | PRN

## 2020-12-23 MED ORDER — SCOPOLAMINE 1 MG/3DAYS TD PT72
1.0000 | MEDICATED_PATCH | TRANSDERMAL | Status: DC
  Administered 2020-12-23: 1.5 mg via TRANSDERMAL

## 2020-12-23 MED ORDER — LIDOCAINE 2% (20 MG/ML) 5 ML SYRINGE
INTRAMUSCULAR | Status: AC
  Filled 2020-12-23: qty 5

## 2020-12-23 MED ORDER — OXYCODONE HCL 5 MG/5ML PO SOLN
5.0000 mg | Freq: Once | ORAL | Status: DC | PRN
Start: 2020-12-23 — End: 2020-12-23

## 2020-12-23 MED ORDER — PROMETHAZINE HCL 25 MG/ML IJ SOLN
6.2500 mg | INTRAMUSCULAR | Status: DC | PRN
Start: 2020-12-23 — End: 2020-12-23

## 2020-12-23 MED ORDER — KETOROLAC TROMETHAMINE 30 MG/ML IJ SOLN
INTRAMUSCULAR | Status: DC | PRN
  Administered 2020-12-23: 15 mg via INTRAVENOUS

## 2020-12-23 MED ORDER — DEXAMETHASONE SODIUM PHOSPHATE 10 MG/ML IJ SOLN
INTRAMUSCULAR | Status: AC
  Filled 2020-12-23: qty 1

## 2020-12-23 MED ORDER — FENTANYL CITRATE (PF) 100 MCG/2ML IJ SOLN
INTRAMUSCULAR | Status: DC | PRN
  Administered 2020-12-23: 50 ug via INTRAVENOUS
  Administered 2020-12-23: 150 ug via INTRAVENOUS
  Administered 2020-12-23: 50 ug via INTRAVENOUS

## 2020-12-23 MED ORDER — POVIDONE-IODINE 10 % EX SWAB: 2.0000 "application " | Freq: Once | CUTANEOUS | Status: DC

## 2020-12-23 MED ORDER — OXYCODONE HCL 5 MG PO TABS: 5.0000 mg | ORAL_TABLET | Freq: Once | ORAL | Status: DC | PRN

## 2020-12-23 MED ORDER — MEPERIDINE HCL 25 MG/ML IJ SOLN: 6.2500 mg | INTRAMUSCULAR | Status: DC | PRN

## 2020-12-23 SURGICAL SUPPLY — 40 items
ADH SKN CLS APL DERMABOND .7 (GAUZE/BANDAGES/DRESSINGS) ×1
BAG RETRIEVAL 10 (BASKET) ×1
CABLE HIGH FREQUENCY MONO STRZ (ELECTRODE) IMPLANT
CATH ROBINSON RED A/P 16FR (CATHETERS) ×2 IMPLANT
COVER MAYO STAND STRL (DRAPES) ×2 IMPLANT
COVER WAND RF STERILE (DRAPES) ×2 IMPLANT
DERMABOND ADVANCED (GAUZE/BANDAGES/DRESSINGS) ×1
DERMABOND ADVANCED .7 DNX12 (GAUZE/BANDAGES/DRESSINGS) ×1 IMPLANT
DRSG COVADERM PLUS 2X2 (GAUZE/BANDAGES/DRESSINGS) IMPLANT
DRSG OPSITE POSTOP 3X4 (GAUZE/BANDAGES/DRESSINGS) IMPLANT
DRSG OPSITE POSTOP 4X10 (GAUZE/BANDAGES/DRESSINGS) ×2 IMPLANT
DRSG TEGADERM 4X4.75 (GAUZE/BANDAGES/DRESSINGS) ×2 IMPLANT
DURAPREP 26ML APPLICATOR (WOUND CARE) ×2 IMPLANT
GAUZE 4X4 16PLY RFD (DISPOSABLE) ×2 IMPLANT
GLOVE ORTHO TXT STRL SZ7.5 (GLOVE) ×2 IMPLANT
GLOVE SRG 8 PF TXTR STRL LF DI (GLOVE) ×1 IMPLANT
GLOVE SURG UNDER POLY LF SZ8 (GLOVE) ×2
GOWN STRL REUS W/TWL XL LVL3 (GOWN DISPOSABLE) ×2 IMPLANT
IV NS IRRIG 3000ML ARTHROMATIC (IV SOLUTION) ×2 IMPLANT
KIT TURNOVER CYSTO (KITS) ×2 IMPLANT
NEEDLE INSUFFLATION 120MM (ENDOMECHANICALS) ×2 IMPLANT
NS IRRIG 1000ML POUR BTL (IV SOLUTION) IMPLANT
NS IRRIG 500ML POUR BTL (IV SOLUTION) ×2 IMPLANT
PACK LAPAROSCOPY BASIN (CUSTOM PROCEDURE TRAY) ×2 IMPLANT
PACK TRENDGUARD 450 HYBRID PRO (MISCELLANEOUS) ×1 IMPLANT
PROTECTOR NERVE ULNAR (MISCELLANEOUS) IMPLANT
SET SUCTION IRRIG HYDROSURG (IRRIGATION / IRRIGATOR) ×2 IMPLANT
SET TUBE SMOKE EVAC HIGH FLOW (TUBING) ×2 IMPLANT
SHEARS HARMONIC ACE PLUS 36CM (ENDOMECHANICALS) ×2 IMPLANT
SUT VIC AB 4-0 PS2 18 (SUTURE) ×2 IMPLANT
SUT VICRYL 0 UR6 27IN ABS (SUTURE) ×4 IMPLANT
SYS BAG RETRIEVAL 10MM (BASKET) ×1
SYSTEM BAG RETRIEVAL 10MM (BASKET) ×1 IMPLANT
TOWEL OR 17X26 10 PK STRL BLUE (TOWEL DISPOSABLE) ×2 IMPLANT
TRENDGUARD 450 HYBRID PRO PACK (MISCELLANEOUS) ×2
TROCAR BALLN 12MMX100 BLUNT (TROCAR) ×2 IMPLANT
TROCAR BLADELESS OPT 5 100 (ENDOMECHANICALS) ×4 IMPLANT
TROCAR XCEL BLUNT TIP 100MML (ENDOMECHANICALS) IMPLANT
TROCAR XCEL NON-BLD 11X100MML (ENDOMECHANICALS) IMPLANT
WARMER LAPAROSCOPE (MISCELLANEOUS) ×2 IMPLANT

## 2020-12-23 NOTE — Transfer of Care (Signed)
Immediate Anesthesia Transfer of Care Note  Patient: [name]  Procedure(s) Performed: LAPAROSCOPIC BILATERAL SALPINGO OOPHORECTOMY (Bilateral Abdomen)  Patient Location: PACU  Anesthesia Type:General  Level of Consciousness: drowsy, patient cooperative and responds to stimulation  Airway & Oxygen Therapy: Patient Spontanous Breathing and Patient connected to face mask oxygen  Post-op Assessment: Report given to RN and Post -op Vital signs reviewed and stable  Post vital signs: Reviewed and stable  Last Vitals:  Vitals Value Taken Time  BP 183/106 12/23/20 1139  Temp    Pulse 102 12/23/20 1143  Resp 18 12/23/20 1143  SpO2 100 % 12/23/20 1143  Vitals shown include unvalidated device data.  Last Pain:  Vitals:   12/23/20 0735  TempSrc: Oral  PainSc: 0-No pain      Patients Stated Pain Goal: 5 (33/35/45 6256)  Complications: No complications documented.

## 2020-12-23 NOTE — Discharge Instructions (Signed)
Routine instructions for laparoscopy.  NO TYLENOL PRODUCTS UNTIL 1:40 PM THIS AFTERNOON.  NO IBUPROFEN OR NAPROXEN CONTAINING PRODUCTS UNTIL 5:20 PM THIS EVENING.  Bilateral Salpingo-Oophorectomy, Care After This sheet gives you information about how to care for yourself after your procedure. Your health care provider may also give you more specific instructions. If you have problems or questions, contact your health care provider. What can I expect after the procedure? After the procedure, it is common to have:  Abdominal pain.  Some occasional vaginal bleeding (spotting).  Tiredness.  Symptoms of menopause, such as hot flashes, night sweats, or mood swings. Follow these instructions at home: Incision care  Keep your incision area and your bandage (dressing) clean and dry.  Follow instructions from your health care provider about how to take care of your incision. Make sure you: ? Wash your hands with soap and water before you change your dressing. If soap and water are not available, use hand sanitizer. ? Change your dressing as told by your health care provider. ? Leave stitches (sutures), staples, skin glue, or adhesive strips in place. These skin closures may need to stay in place for 2 weeks or longer. If adhesive strip edges start to loosen and curl up, you may trim the loose edges. Do not remove adhesive strips completely unless your health care provider tells you to do that.  Check your incision area every day for signs of infection. Check for: ? Redness, swelling, or pain. ? Fluid or blood. ? Warmth. ? Pus or a bad smell.   Activity  Do not drive or use heavy machinery while taking prescription pain medicine.  Do not drive for 24 hours if you received a medicine to help you relax (sedative) during your procedure.  Take frequent, short walks throughout the day. Rest when you get tired. Ask your health care provider what activities are safe for you.  Avoid activity  that requires great effort. Also, avoid heavy lifting. Do not lift anything that is heavier than 10 lbs. (4.5 kg), or the limit that your health care provider tells you, until he or she says that it is safe to do so.  Do not douche, use tampons, or have sex until your health care provider approves.   General instructions  To prevent or treat constipation while you are taking prescription pain medicine, your health care provider may recommend that you: ? Drink enough fluid to keep your urine clear or pale yellow. ? Take over-the-counter or prescription medicines. ? Eat foods that are high in fiber, such as fresh fruits and vegetables, whole grains, and beans. ? Limit foods that are high in fat and processed sugars, such as fried and sweet foods.  Take over-the-counter and prescription medicines only as told by your health care provider.  Do not take baths, swim, or use a hot tub until your health care provider approves. Ask your health care provider if you can take showers. You may only be allowed to take sponge baths for bathing.  Wear compression stockings as told by your health care provider. These stockings help to prevent blood clots and reduce swelling in your legs.  Keep all follow-up visits as told by your health care provider. This is important.   Contact a health care provider if:  You have pain when you urinate.  You have pus or a bad smelling discharge coming from your vagina.  You have redness, swelling, or pain around your incision.  You have fluid or blood coming  from your incision.  Your incision feels warm to the touch.  You have pus or a bad smell coming from your incision.  You have a fever.  Your incision starts to break open.  You have pain in the abdomen, and it gets worse or does not get better when you take medicine.  You develop a rash.  You develop nausea and vomiting.  You feel lightheaded. Get help right away if:  You develop pain in your chest  or leg.  You become short of breath.  You faint.  You have increased bleeding from your vagina. Summary  After the procedure, it is common to have pain, bleeding in the vagina, and symptoms of menopause.  Follow instructions from your health care provider about how to take care of your incision.  Follow instructions from your health care provider about activities and restrictions.  Check your incision every day for signs of infection and report any symptoms to your health care provider. This information is not intended to replace advice given to you by your health care provider. Make sure you discuss any questions you have with your health care provider. Document Revised: 12/05/2018 Document Reviewed: 11/05/2016 Elsevier Patient Education  2021 [employer] Instructions  Activity: Get plenty of rest for the remainder of the day. A responsible individual must stay with you for 24 hours following the procedure.  For the next 24 hours, DO NOT: -Drive a car -Paediatric nurse -Drink alcoholic beverages -Take any medication unless instructed by your physician -Make any legal decisions or sign important papers.  Meals: Start with liquid foods such as gelatin or soup. Progress to regular foods as tolerated. Avoid greasy, spicy, heavy foods. If nausea and/or vomiting occur, drink only clear liquids until the nausea and/or vomiting subsides. Call your physician if vomiting continues.  Special Instructions/Symptoms: Your throat may feel dry or sore from the anesthesia or the breathing tube placed in your throat during surgery. If this causes discomfort, gargle with warm salt water. The discomfort should disappear within 24 hours.  If you had a scopolamine patch placed behind your ear for the management of post- operative nausea and/or vomiting:  1. The medication in the patch is effective for 72 hours, after which it should be removed.  Wrap patch in a  tissue and discard in the trash. Wash hands thoroughly with soap and water. 2. You may remove the patch earlier than 72 hours if you experience unpleasant side effects which may include dry mouth, dizziness or visual disturbances. 3. Avoid touching the patch. Wash your hands with soap and water after contact with the patch.    Remove patch behind left ear by Monday, December 26, 2020.

## 2020-12-23 NOTE — Interval H&P Note (Signed)
History and Physical Interval Note:  12/23/2020 9:16 AM  [name]  has presented today for surgery, with the diagnosis of increased risk of ovarian cancer Z15.02.  The various methods of treatment have been discussed with the patient and family. After consideration of risks, benefits and other options for treatment, the patient has consented to  Procedure(s): LAPAROSCOPIC BILATERAL SALPINGO OOPHORECTOMY (Bilateral) as a surgical intervention.  The patient's history has been reviewed, patient examined, no change in status, stable for surgery.  I have reviewed the patient's chart and labs.  Questions were answered to the patient's satisfaction.     [name]

## 2020-12-23 NOTE — Op Note (Signed)
Obstetrics Op Note 03/25/2015 8:27 AM    Expand All Collapse All   Preoperative diagnosis: Elevated risk of ovarian cancer Postoperative diagnosis: Same Procedure: Open laparoscopy with bilateral salpingo-oophorectomy Surgeon: Cheri Fowler M.D. Anesthesia: Gen. Endotracheal tube Findings: She had significant omental adhesions to the anterior abdominal wall, also some adhesions around the left tube and ovary.  Tubes and ovaries looked normal, uterus absent Estimated blood loss: 50cc Specimens: Bilateral tubes and ovaries Complications: None  Procedure in detail  The patient was taken to the operating room and placed in the dorsal supine position. General anesthesia was induced. Her left arm was tucked to her side and legs were placed in the mobile stirrups. Abdomen perineum and vagina were then prepped and draped in the usual sterile fashion, bladder drained with a red Robinson catheter. Infraumbilical skin was then infiltrated with quarter percent Marcaine and a 3 cm horizontal incision was made. Fascia was identified and elevated and entered sharply. Peritoneum was then also elevated and entered sharply. The incision was extended bilaterally. A pursestring suture of 0 Vicryl was placed around the fascia and the Hassan cannula was inserted and secured. Abdomen was insufflated with CO2 and good visualization was achieved. 5 mm ports were placed on the right and low in the midline   under direct visualization. Inspection revealed the above-mentioned findings. Initially, just using the port on the right side, it was necessary to take down omental adhesions with harmonic scalpel to help visualize the pelvis.  This was significant adhesiolysis.  The port was then placed low in the midline as I could not see her left side well due to the omental adhesions. I was then able to visualize the left tube and ovary. Adhesions taken down with harmonic scalpel.  The fallopian tube was removed with harmonic  scalpel.  I was then able to grasp the ovary and gradually remove it with harmonic scalpel.  The tube was removed through the trocar, the ovary was placed in the pelvis.  I was then able to visualize the right tube and ovary, and able to grasp and elevate both.  Harmonic scalpel was used to take down the IP ligament and all attachments to free the tube and ovary.  Pedicles were hemostatic. The 5 mm scope was then placed through the port on the right and an Endo Catch bag was placed through the umbilical port. The left ovary and right tube and ovary were scooped into the bag and brought to the umbilical incision. The bag was then easily removed through the umbilical incision after removing the trocar.  The previously placed pursestring suture was tied with good fascial closure, this was reinforced with 2 more layers of 0 Vicryl. 5 mm ports were removed. All skin incisions were closed with subcuticular sutures of 4-0 Vicryl followed by Dermabond and a sterile dressing over the umbilical incision. The patient was awakened in the upper abdomen taken to the recovery room in stable condition after tolerating the procedure well. Counts were correct and she had PAS hose on throughout the procedure.

## 2020-12-23 NOTE — Anesthesia Postprocedure Evaluation (Signed)
Anesthesia Post Note  Patient: [name]  Procedure(s) Performed: LAPAROSCOPIC BILATERAL SALPINGO OOPHORECTOMY (Bilateral Abdomen)     Patient location during evaluation: Phase II Anesthesia Type: General Level of consciousness: awake and alert, patient cooperative and oriented Pain management: pain level controlled Vital Signs Assessment: post-procedure vital signs reviewed and stable Respiratory status: nonlabored ventilation, spontaneous breathing and respiratory function stable Cardiovascular status: stable and blood pressure returned to baseline Postop Assessment: no apparent nausea or vomiting, able to ambulate and adequate PO intake Anesthetic complications: no   No complications documented.  Last Vitals:  Vitals:   12/23/20 1230 12/23/20 1245  BP: (!) 188/101 (!) 176/89  Pulse: 87 86  Resp: 14 12  Temp:    SpO2: 95% 96%    Last Pain:  Vitals:   12/23/20 1245  TempSrc:   PainSc: 0-No pain                 Shirleyann Montero,E. Hugh Kamara

## 2020-12-23 NOTE — Anesthesia Procedure Notes (Signed)
Procedure Name: Intubation Date/Time: 12/23/2020 9:37 AM Performed by: Rogers Blocker, CRNA Pre-anesthesia Checklist: Patient identified, Emergency Drugs available, Suction available and Patient being monitored Patient Re-evaluated:Patient Re-evaluated prior to induction Oxygen Delivery Method: Circle System Utilized Preoxygenation: Pre-oxygenation with 100% oxygen Induction Type: IV induction Ventilation: Mask ventilation without difficulty Laryngoscope Size: Mac and 3 Grade View: Grade I Tube type: Oral Number of attempts: 1 Airway Equipment and Method: Stylet Placement Confirmation: ETT inserted through vocal cords under direct vision,  positive ETCO2 and breath sounds checked- equal and bilateral Secured at: 21 cm Tube secured with: Tape Dental Injury: Teeth and Oropharynx as per pre-operative assessment

## 2020-12-23 NOTE — Addendum Note (Signed)
Addendum  created 12/23/20 1608 by Rogers Blocker, CRNA   Charge Capture section accepted

## 2020-12-26 ENCOUNTER — Encounter (HOSPITAL_BASED_OUTPATIENT_CLINIC_OR_DEPARTMENT_OTHER): Payer: Self-pay | Admitting: Obstetrics and Gynecology

## 2020-12-26 LAB — SURGICAL PATHOLOGY

## 2021-02-22 ENCOUNTER — Other Ambulatory Visit: Payer: Self-pay

## 2021-02-22 ENCOUNTER — Other Ambulatory Visit: Payer: Self-pay | Admitting: Family Medicine

## 2021-02-22 ENCOUNTER — Telehealth: Payer: Self-pay

## 2021-02-22 DIAGNOSIS — E119 Type 2 diabetes mellitus without complications: Secondary | ICD-10-CM

## 2021-02-22 DIAGNOSIS — E782 Mixed hyperlipidemia: Secondary | ICD-10-CM

## 2021-02-22 DIAGNOSIS — I10 Essential (primary) hypertension: Secondary | ICD-10-CM

## 2021-02-22 MED ORDER — METFORMIN HCL 500 MG PO TABS: ORAL_TABLET | ORAL | 0 refills | Status: DC

## 2021-02-22 MED ORDER — GLIMEPIRIDE 2 MG PO TABS: ORAL_TABLET | ORAL | 3 refills | Status: DC

## 2021-02-22 MED ORDER — ATORVASTATIN CALCIUM 10 MG PO TABS: ORAL_TABLET | ORAL | 0 refills | Status: DC

## 2021-02-22 NOTE — Telephone Encounter (Signed)
Patient called need med refills  atorvastatin (LIPITOR) 10 MG tablet   glimepiride (AMARYL) 2 MG tablet  metFORMIN (GLUCOPHAGE) 500 MG tablet   Levasy

## 2021-02-22 NOTE — Telephone Encounter (Signed)
Rx's refilled. 

## 2021-03-16 ENCOUNTER — Ambulatory Visit: Payer: 59 | Admitting: Internal Medicine

## 2021-04-24 ENCOUNTER — Other Ambulatory Visit: Payer: Self-pay | Admitting: Nurse Practitioner

## 2021-04-24 DIAGNOSIS — E782 Mixed hyperlipidemia: Secondary | ICD-10-CM

## 2021-04-24 DIAGNOSIS — E119 Type 2 diabetes mellitus without complications: Secondary | ICD-10-CM

## 2021-04-25 ENCOUNTER — Other Ambulatory Visit: Payer: Self-pay

## 2021-04-25 DIAGNOSIS — I10 Essential (primary) hypertension: Secondary | ICD-10-CM

## 2021-04-27 ENCOUNTER — Other Ambulatory Visit: Payer: Self-pay | Admitting: Nurse Practitioner

## 2021-04-27 DIAGNOSIS — I10 Essential (primary) hypertension: Secondary | ICD-10-CM

## 2021-05-03 ENCOUNTER — Other Ambulatory Visit: Payer: Self-pay | Admitting: *Deleted

## 2021-05-03 MED ORDER — SUMATRIPTAN SUCCINATE 100 MG PO TABS: 100.0000 mg | ORAL_TABLET | ORAL | 2 refills | Status: DC | PRN

## 2021-06-08 ENCOUNTER — Ambulatory Visit (INDEPENDENT_AMBULATORY_CARE_PROVIDER_SITE_OTHER): Payer: 59 | Admitting: Nurse Practitioner

## 2021-06-08 ENCOUNTER — Other Ambulatory Visit: Payer: Self-pay

## 2021-06-08 ENCOUNTER — Encounter: Payer: Self-pay | Admitting: Nurse Practitioner

## 2021-06-08 ENCOUNTER — Encounter: Payer: 59 | Admitting: Family Medicine

## 2021-06-08 VITALS — BP 157/88 | HR 104 | Ht 66.0 in | Wt 241.0 lb

## 2021-06-08 DIAGNOSIS — I10 Essential (primary) hypertension: Secondary | ICD-10-CM | POA: Diagnosis not present

## 2021-06-08 DIAGNOSIS — Z23 Encounter for immunization: Secondary | ICD-10-CM

## 2021-06-08 DIAGNOSIS — E119 Type 2 diabetes mellitus without complications: Secondary | ICD-10-CM

## 2021-06-08 DIAGNOSIS — Z0001 Encounter for general adult medical examination with abnormal findings: Secondary | ICD-10-CM

## 2021-06-08 DIAGNOSIS — E782 Mixed hyperlipidemia: Secondary | ICD-10-CM | POA: Diagnosis not present

## 2021-06-08 DIAGNOSIS — Z6838 Body mass index (BMI) 38.0-38.9, adult: Secondary | ICD-10-CM

## 2021-06-08 DIAGNOSIS — J302 Other seasonal allergic rhinitis: Secondary | ICD-10-CM | POA: Insufficient documentation

## 2021-06-08 MED ORDER — OLOPATADINE HCL 0.1 % OP SOLN: 1.0000 [drp] | Freq: Two times a day (BID) | OPHTHALMIC | 12 refills | Status: AC

## 2021-06-08 MED ORDER — TIRZEPATIDE 5 MG/0.5ML ~~LOC~~ SOAJ: 5.0000 mg | SUBCUTANEOUS | 0 refills | Status: DC

## 2021-06-08 MED ORDER — TIRZEPATIDE 2.5 MG/0.5ML ~~LOC~~ SOAJ: 2.5000 mg | SUBCUTANEOUS | 0 refills | Status: DC

## 2021-06-08 MED ORDER — FLUTICASONE PROPIONATE 50 MCG/ACT NA SUSP: 2.0000 | Freq: Every day | NASAL | 6 refills | Status: DC

## 2021-06-08 NOTE — Assessment & Plan Note (Signed)
-  checking A1c -STOP glimepiride 2 mg daily -Rx. mounjaro- no hx or fam hx of medullary thyroid CA

## 2021-06-08 NOTE — Patient Instructions (Signed)
Please have fasting labs drawn in the next 7 days (can be done today if fasting).

## 2021-06-08 NOTE — Progress Notes (Signed)
Established Patient Office Visit  Subjective:  Patient ID: [name], female    DOB: Aug 03, 1966  Age: 55 y.o. MRN: [id number]  CC:  Chief Complaint  Patient presents with   Annual Exam    CPE    HPI [name] presents for physical exam. No recent labs.  She checks blood sugars BID. States home blood sugars have been great.  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   Obesity 03/30/2020   Wears glasses     Past Surgical History:  Procedure Laterality Date   ABDOMINAL HYSTERECTOMY  several yrs ago   partial   BREAST SURGERY  yrs ago   biopsy    DILATION AND CURETTAGE OF UTERUS     LAPAROSCOPIC BILATERAL SALPINGO OOPHERECTOMY Bilateral 12/23/2020   Procedure: LAPAROSCOPIC BILATERAL SALPINGO OOPHORECTOMY;  Surgeon: Cheri Fowler, MD;  Location: [place];  Service: Gynecology;  Laterality: Bilateral;   NASAL SINUS SURGERY  2006   deviated septum    Family History  Problem Relation Age of Onset   Hypertension Mother    Breast cancer Mother 63   Polycythemia Father    Pancreatic cancer Father    Lung cancer Brother        smoker   Breast cancer Maternal Aunt        dx over 54   Ovarian cancer Maternal Aunt    Colon cancer Maternal Uncle        dx over 42   Cancer Paternal Aunt        Vulvar cancer   Stroke Maternal Grandmother    Prostate cancer Maternal Grandfather     Pancreatic cancer Paternal Grandmother    Polycythemia Brother    Kidney cancer Paternal Aunt        d. late 31s-early 34s    Social History   Socioeconomic History   Marital status: Married    Spouse name: Scientist, physiological    Number of children: 1   Years of education: Not on file   Highest education level: 12th grade  Occupational History   Not on file  Tobacco Use   Smoking status: Never   Smokeless tobacco: Never  Vaping Use   Vaping Use: Never used  Substance and Sexual Activity   Alcohol use: No   Drug use: No   Sexual activity: Yes    Comment: hysterectomy  Other Topics Concern   Not on file  Social History Narrative   Lives with husband [name] of 69 years.   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 As needed for asthma.     Ascorbic Acid (VITAMIN C WITH ROSE HIPS) 500 MG tablet Take 500 mg by mouth daily.     atorvastatin (LIPITOR) 10 MG tablet TAKE ONE TABLET ($RemoveBef'10MG'BevIIkqXRK$  TOTAL) BY MOUTH DAILY 90 tablet 0   cetirizine (ZYRTEC) 10 MG tablet Take 10 mg by mouth daily.     esomeprazole (NEXIUM) 20 MG capsule Take 20 mg by mouth daily at 12 noon.     loratadine (CLARITIN) 10 MG tablet Take 10 mg by mouth daily.     losartan-hydrochlorothiazide (HYZAAR)  50-12.5 MG tablet TAKE ONE (1) TABLET BY MOUTH EVERY DAY 90 tablet 3   metFORMIN (GLUCOPHAGE) 500 MG tablet TAKE TWO (2) TABLETS BY MOUTH 2 TIMES DAILY 360 tablet 0   Omega-3 Fatty Acids (FISH OIL) 1200 MG CAPS Take 1 capsule by mouth daily.     OVER THE COUNTER MEDICATION Take 2 tablets by mouth daily. Womens Mutilvitamin     OVER THE COUNTER MEDICATION Querctin 500 mg dialy     SUMAtriptan (IMITREX) 100 MG tablet Take 1 tablet (100 mg total) by mouth every 2 (two) hours as needed. 10 tablet 2   UNABLE TO FIND Med Name: vit d 3 2000 iu daily     UNKNOWN TO PATIENT Maintenance powder inhaler     vitamin E 180 MG (400 UNITS) capsule Take 400 Units by mouth daily.     zinc gluconate 50 MG tablet Take 50 mg by mouth daily.     glimepiride (AMARYL) 2 MG tablet TAKE ONE (1) TABLET BY MOUTH EVERY DAY BEFORE BREAKFAST 30 tablet 3   HYDROcodone-acetaminophen (NORCO/VICODIN) 5-325 MG tablet Take 1 tablet by mouth every 4 (four) hours as needed for severe pain. 15 tablet 0   No facility-administered medications prior to visit.    Allergies  Allergen Reactions   Other Hives and Itching   Cefoxitin Hives and Itching    ROS Review of Systems  Constitutional: Negative.   HENT: Negative.    Eyes: Negative.   Respiratory: Negative.    Cardiovascular: Negative.   Gastrointestinal: Negative.   Endocrine: Negative.   Genitourinary: Negative.   Musculoskeletal: Negative.   Skin: Negative.   Allergic/Immunologic: Negative.   Neurological: Negative.   Hematological: Negative.   Psychiatric/Behavioral: Negative.       Objective:    Physical Exam Constitutional:      Appearance: Normal appearance. She is obese.  HENT:     Head: Normocephalic and atraumatic.     Right Ear: Tympanic membrane, ear canal and external ear normal.     Left Ear: Tympanic membrane, ear canal and external ear normal.     Ears:     Comments: Serous fluid behind left TM    Nose: Nose normal.     Mouth/Throat:      Mouth: Mucous membranes are  moist.     Pharynx: Oropharynx is clear.  Eyes:     Extraocular Movements: Extraocular movements intact.     Conjunctiva/sclera: Conjunctivae normal.     Pupils: Pupils are equal, round, and reactive to light.  Cardiovascular:     Rate and Rhythm: Normal rate and regular rhythm.     Pulses: Normal pulses.          Dorsalis pedis pulses are 2+ on the right side and 2+ on the left side.     Heart sounds: Normal heart sounds.  Pulmonary:     Effort: Pulmonary effort is normal.     Breath sounds: Normal breath sounds.  Abdominal:     General: Abdomen is flat. Bowel sounds are normal. There is no distension.     Palpations: Abdomen is soft. There is no mass.     Tenderness: There is no abdominal tenderness. There is no guarding or rebound.     Hernia: No hernia is present.  Musculoskeletal:        General: Normal range of motion.     Cervical back: Normal range of motion and neck supple.  Feet:     Right foot:     Protective Sensation: 10 sites tested.  10 sites sensed.     Skin integrity: Dry skin present.     Toenail Condition: Right toenails are normal.     Left foot:     Protective Sensation: 10 sites tested.  10 sites sensed.     Skin integrity: Dry skin present.     Toenail Condition: Left toenails are normal.  Skin:    General: Skin is warm and dry.     Capillary Refill: Capillary refill takes less than 2 seconds.  Neurological:     General: No focal deficit present.     Mental Status: She is alert and oriented to person, place, and time.     Cranial Nerves: No cranial nerve deficit.     Sensory: No sensory deficit.     Motor: No weakness.     Coordination: Coordination normal.     Gait: Gait normal.  Psychiatric:        Mood and Affect: Mood normal.        Behavior: Behavior normal.        Thought Content: Thought content normal.        Judgment: Judgment normal.    BP (!) 157/88 (BP Location: Left Arm, Patient Position: Sitting, Cuff  Size: Large)   Pulse (!) 104   Ht $R'5\' 6"'BU$  (1.676 m)   Wt 241 lb (109.3 kg)   SpO2 93%   BMI 38.90 kg/m  Wt Readings from Last 3 Encounters:  06/08/21 241 lb (109.3 kg)  12/23/20 246 lb (111.6 kg)  12/20/20 245 lb (111.1 kg)     Health Maintenance Due  Topic Date Due   OPHTHALMOLOGY EXAM  Never done   Hepatitis C Screening  Never done   COLONOSCOPY (Pts 45-41yrs Insurance coverage will need to be confirmed)  Never done   HEMOGLOBIN A1C  03/02/2021   INFLUENZA VACCINE  05/15/2021    There are no preventive care reminders to display for this patient.  Lab Results  Component Value Date   TSH 2.000 09/02/2020   Lab Results  Component Value Date   WBC 6.4 12/20/2020   HGB 13.6 12/20/2020   HCT 44.2 12/20/2020   MCV 84.8 12/20/2020   PLT 222 12/20/2020   Lab Results  Component Value Date  NA 139 12/20/2020   K 4.7 12/20/2020   CO2 30 12/20/2020   GLUCOSE 147 (H) 12/20/2020   BUN 25 (H) 12/20/2020   CREATININE 0.66 12/20/2020   BILITOT 0.7 12/20/2020   ALKPHOS 71 12/20/2020   AST 23 12/20/2020   ALT 23 12/20/2020   PROT 7.9 12/20/2020   ALBUMIN 4.5 12/20/2020   CALCIUM 10.3 12/20/2020   ANIONGAP 8 12/20/2020   Lab Results  Component Value Date   CHOL 140 09/02/2020   Lab Results  Component Value Date   HDL 48 09/02/2020   Lab Results  Component Value Date   LDLCALC 68 09/02/2020   Lab Results  Component Value Date   TRIG 136 09/02/2020   Lab Results  Component Value Date   CHOLHDL 2.9 09/02/2020   Lab Results  Component Value Date   HGBA1C 7.0 (H) 09/02/2020      Assessment & Plan:   Problem List Items Addressed This Visit       Cardiovascular and Mediastinum   Essential hypertension    BP Readings from Last 3 Encounters:  06/08/21 (!) 157/88  12/23/20 (!) 150/96  12/20/20 (!) 163/99  -BP elevated today; recheck in 1 month       Relevant Orders   CBC with Differential/Platelet   CMP14+EGFR   Lipid Panel With LDL/HDL Ratio      Endocrine   Diabetes mellitus without complication (HCC)    -checking A1c -STOP glimepiride 2 mg daily -Rx. mounjaro- no hx or fam hx of medullary thyroid CA      Relevant Medications   tirzepatide (MOUNJARO) 2.5 MG/0.5ML Pen   tirzepatide (MOUNJARO) 5 MG/0.5ML Pen   Other Relevant Orders   Hemoglobin A1c     Other   Hyperlipidemia    -checking cholesterol      Obesity    -Rx. mounjaro for DM; expect some weight loss  Wt Readings from Last 3 Encounters:  06/08/21 241 lb (109.3 kg)  12/23/20 246 lb (111.6 kg)  12/20/20 245 lb (111.1 kg)   BMI Readings from Last 3 Encounters:  06/08/21 38.90 kg/m  12/23/20 39.71 kg/m  12/20/20 39.54 kg/m         Relevant Medications   tirzepatide (MOUNJARO) 2.5 MG/0.5ML Pen   tirzepatide (MOUNJARO) 5 MG/0.5ML Pen   Other Relevant Orders   TSH   Seasonal allergies    -Rx. patanol -Rx. flonase      Relevant Medications   fluticasone (FLONASE) 50 MCG/ACT nasal spray   olopatadine (PATANOL) 0.1 % ophthalmic solution   Other Visit Diagnoses     Encounter for general adult medical examination with abnormal findings    -  Primary   Relevant Orders   CBC with Differential/Platelet   CMP14+EGFR   Lipid Panel With LDL/HDL Ratio   Hemoglobin A1c   TSH       Meds ordered this encounter  Medications   tirzepatide (MOUNJARO) 2.5 MG/0.5ML Pen    Sig: Inject 2.5 mg into the skin once a week.    Dispense:  2 mL    Refill:  0   tirzepatide (MOUNJARO) 5 MG/0.5ML Pen    Sig: Inject 5 mg into the skin once a week.    Dispense:  6 mL    Refill:  0   fluticasone (FLONASE) 50 MCG/ACT nasal spray    Sig: Place 2 sprays into both nostrils daily.    Dispense:  16 g    Refill:  6   olopatadine (PATANOL) 0.1 %  ophthalmic solution    Sig: Place 1 drop into the left eye 2 (two) times daily.    Dispense:  5 mL    Refill:  12     Follow-up: Return in about 1 month (around 07/09/2021) for Med check Darcel Bayley).    Noreene Larsson, NP

## 2021-06-08 NOTE — Assessment & Plan Note (Signed)
-  Rx. patanol -Rx. flonase

## 2021-06-08 NOTE — Assessment & Plan Note (Addendum)
BP Readings from Last 3 Encounters:  06/08/21 (!) 157/88  12/23/20 (!) 150/96  12/20/20 (!) 163/99   -BP elevated today; recheck in 1 month

## 2021-06-08 NOTE — Assessment & Plan Note (Signed)
-  checking cholesterol

## 2021-06-08 NOTE — Assessment & Plan Note (Addendum)
-  Rx. mounjaro for DM; expect some weight loss  Wt Readings from Last 3 Encounters:  06/08/21 241 lb (109.3 kg)  12/23/20 246 lb (111.6 kg)  12/20/20 245 lb (111.1 kg)   BMI Readings from Last 3 Encounters:  06/08/21 38.90 kg/m  12/23/20 39.71 kg/m  12/20/20 39.54 kg/m

## 2021-06-08 NOTE — Addendum Note (Signed)
Addended by: Demetrius Revel on: 06/08/2021 02:45 PM   Modules accepted: Orders

## 2021-06-10 LAB — CMP14+EGFR
ALT: 17 IU/L (ref 0–32)
AST: 16 IU/L (ref 0–40)
Albumin/Globulin Ratio: 2.2 (ref 1.2–2.2)
Albumin: 4.8 g/dL (ref 3.8–4.9)
Alkaline Phosphatase: 82 IU/L (ref 44–121)
BUN/Creatinine Ratio: 26 — ABNORMAL HIGH (ref 9–23)
BUN: 17 mg/dL (ref 6–24)
Bilirubin Total: 0.6 mg/dL (ref 0.0–1.2)
CO2: 26 mmol/L (ref 20–29)
Calcium: 9.9 mg/dL (ref 8.7–10.2)
Chloride: 98 mmol/L (ref 96–106)
Creatinine, Ser: 0.65 mg/dL (ref 0.57–1.00)
Globulin, Total: 2.2 g/dL (ref 1.5–4.5)
Glucose: 174 mg/dL — ABNORMAL HIGH (ref 65–99)
Potassium: 4.3 mmol/L (ref 3.5–5.2)
Sodium: 139 mmol/L (ref 134–144)
Total Protein: 7 g/dL (ref 6.0–8.5)
eGFR: 103 mL/min/{1.73_m2} (ref >59)

## 2021-06-10 LAB — LIPID PANEL WITH LDL/HDL RATIO
Cholesterol, Total: 144 mg/dL (ref 100–199)
HDL: 51 mg/dL (ref >39)
LDL Chol Calc (NIH): 71 mg/dL (ref 0–99)
LDL/HDL Ratio: 1.4 ratio (ref 0.0–3.2)
Triglycerides: 126 mg/dL (ref 0–149)
VLDL Cholesterol Cal: 22 mg/dL (ref 5–40)

## 2021-06-10 LAB — CBC WITH DIFFERENTIAL/PLATELET
Basophils Absolute: 0 10*3/uL (ref 0.0–0.2)
Basos: 1 %
EOS (ABSOLUTE): 0.1 10*3/uL (ref 0.0–0.4)
Eos: 2 %
Hematocrit: 46.8 % — ABNORMAL HIGH (ref 34.0–46.6)
Hemoglobin: 14.9 g/dL (ref 11.1–15.9)
Immature Grans (Abs): 0 10*3/uL (ref 0.0–0.1)
Immature Granulocytes: 0 %
Lymphocytes Absolute: 2 10*3/uL (ref 0.7–3.1)
Lymphs: 30 %
MCH: 25.8 pg — ABNORMAL LOW (ref 26.6–33.0)
MCHC: 31.8 g/dL (ref 31.5–35.7)
MCV: 81 fL (ref 79–97)
Monocytes Absolute: 0.5 10*3/uL (ref 0.1–0.9)
Monocytes: 7 %
Neutrophils Absolute: 4 10*3/uL (ref 1.4–7.0)
Neutrophils: 60 %
Platelets: 240 10*3/uL (ref 150–450)
RBC: 5.78 x10E6/uL — ABNORMAL HIGH (ref 3.77–5.28)
RDW: 14.5 % (ref 11.7–15.4)
WBC: 6.6 10*3/uL (ref 3.4–10.8)

## 2021-06-10 LAB — HEMOGLOBIN A1C
Est. average glucose Bld gHb Est-mCnc: 148 mg/dL
Hgb A1c MFr Bld: 6.8 % — ABNORMAL HIGH (ref 4.8–5.6)

## 2021-06-10 LAB — TSH: TSH: 2.02 u[IU]/mL (ref 0.450–4.500)

## 2021-06-12 NOTE — Progress Notes (Signed)
A1c is 6.8%, so it is at goal. RBC is elevated, but we will recheck that at her next appt.

## 2021-06-22 ENCOUNTER — Telehealth: Payer: Self-pay

## 2021-06-22 NOTE — Telephone Encounter (Signed)
Patient called this medicine tirzepatide [name]) 5 MG/0.5ML Pen instructions says do not start until 09/22 can you give patient a call back to verify. [phone]

## 2021-06-22 NOTE — Telephone Encounter (Signed)
Pt informed

## 2021-06-28 ENCOUNTER — Other Ambulatory Visit: Payer: Self-pay | Admitting: Nurse Practitioner

## 2021-06-28 DIAGNOSIS — E782 Mixed hyperlipidemia: Secondary | ICD-10-CM

## 2021-06-28 DIAGNOSIS — E119 Type 2 diabetes mellitus without complications: Secondary | ICD-10-CM

## 2021-07-10 ENCOUNTER — Other Ambulatory Visit: Payer: Self-pay

## 2021-07-10 ENCOUNTER — Encounter: Payer: Self-pay | Admitting: Nurse Practitioner

## 2021-07-10 ENCOUNTER — Ambulatory Visit: Payer: 59 | Admitting: Nurse Practitioner

## 2021-07-10 VITALS — BP 144/95 | HR 104 | Temp 98.6°F | Ht 66.0 in | Wt 239.1 lb

## 2021-07-10 DIAGNOSIS — E119 Type 2 diabetes mellitus without complications: Secondary | ICD-10-CM

## 2021-07-10 DIAGNOSIS — Z6838 Body mass index (BMI) 38.0-38.9, adult: Secondary | ICD-10-CM

## 2021-07-10 DIAGNOSIS — D751 Secondary polycythemia: Secondary | ICD-10-CM | POA: Insufficient documentation

## 2021-07-10 DIAGNOSIS — E782 Mixed hyperlipidemia: Secondary | ICD-10-CM

## 2021-07-10 NOTE — Progress Notes (Signed)
Acute Office Visit  Subjective:    Patient ID: [name], female    DOB: January 09, 1966, 55 y.o.   MRN: [id number]  Chief Complaint  Patient presents with   Follow-up    1 month med follow up    HPI Patient is in today for medication follow-up. She was started on mounjaro on 06/08/21.  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   Obesity 03/30/2020   Wears glasses     Past Surgical History:  Procedure Laterality Date   ABDOMINAL HYSTERECTOMY  several yrs ago   partial   BREAST SURGERY  yrs ago   biopsy    DILATION AND CURETTAGE OF UTERUS     LAPAROSCOPIC BILATERAL SALPINGO OOPHERECTOMY Bilateral 12/23/2020   Procedure: LAPAROSCOPIC BILATERAL SALPINGO OOPHORECTOMY;  Surgeon: Cheri Fowler, MD;  Location: [place] [facility];  Service: Gynecology;  Laterality: Bilateral;   NASAL SINUS SURGERY  2006   deviated septum    Family History  Problem Relation Age of Onset   Hypertension Mother    Breast cancer Mother 63   Polycythemia Father    Pancreatic cancer Father    Lung cancer Brother        smoker   Breast cancer Maternal Aunt        dx over 69   Ovarian cancer Maternal Aunt    Colon cancer Maternal Uncle        dx over 26   Cancer Paternal Aunt        Vulvar cancer   Stroke Maternal Grandmother    Prostate cancer Maternal Grandfather    Pancreatic cancer Paternal Grandmother    Polycythemia  Brother    Kidney cancer Paternal Aunt        d. late 21s-early 67s    Social History   Socioeconomic History   Marital status: Married    Spouse name: Scientist, physiological    Number of children: 1   Years of education: Not on file   Highest education level: 12th grade  Occupational History   Not on file  Tobacco Use   Smoking status: Never   Smokeless tobacco: Never  Vaping Use   Vaping Use: Never used  Substance and Sexual Activity   Alcohol use: No   Drug use: No   Sexual activity: Yes    Comment: hysterectomy  Other Topics Concern   Not on file  Social History Narrative   Lives with husband [name] of 50 years.   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 As needed for asthma.     Ascorbic Acid (VITAMIN C WITH ROSE HIPS) 500 MG tablet Take 500 mg by mouth daily.     atorvastatin (LIPITOR) 10 MG tablet TAKE ONE TABLET (10MG TOTAL) BY MOUTH DAILY 90 tablet 0   cetirizine (ZYRTEC) 10 MG tablet Take 10 mg by mouth daily.     esomeprazole (NEXIUM) 20 MG capsule Take 20 mg by mouth daily at 12 noon.     fluticasone (FLONASE) 50 MCG/ACT nasal spray Place 2 sprays into both nostrils daily. 16 g 6   loratadine (CLARITIN) 10 MG tablet Take 10 mg by mouth daily.      losartan-hydrochlorothiazide (HYZAAR) 50-12.5 MG tablet TAKE ONE (1) TABLET BY MOUTH EVERY DAY 90 tablet 3   metFORMIN (GLUCOPHAGE) 500 MG tablet TAKE TWO (2) TABLETS BY MOUTH 2 TIMES DAILY 360 tablet 0   olopatadine (PATANOL) 0.1 % ophthalmic solution Place 1 drop into the left eye 2 (two) times daily. 5 mL 12   Omega-3 Fatty Acids (FISH OIL) 1200 MG CAPS Take 1 capsule by mouth daily.     OVER THE COUNTER MEDICATION Take 2 tablets by mouth daily. Womens Mutilvitamin     OVER THE COUNTER MEDICATION Querctin 500 mg dialy     SUMAtriptan (IMITREX) 100 MG tablet Take 1 tablet (100 mg total) by mouth every 2 (two) hours as needed. 10 tablet 2   tirzepatide (MOUNJARO) 2.5 MG/0.5ML Pen Inject 2.5 mg into the skin once a week. 2 mL 0   tirzepatide (MOUNJARO) 5 MG/0.5ML Pen Inject 5 mg into the skin once a week. 6 mL 0   UNABLE TO FIND Med Name: vit d 3 2000 iu daily     UNKNOWN TO PATIENT Maintenance powder inhaler     vitamin E 180 MG (400 UNITS) capsule Take 400 Units by mouth daily.     zinc gluconate 50 MG tablet Take 50 mg by mouth daily.     No facility-administered medications prior to visit.    Allergies  Allergen Reactions   Other Hives and Itching   Cefoxitin Hives and Itching    Review of Systems  Constitutional: Negative.   Respiratory: Negative.    Cardiovascular: Negative.   Musculoskeletal: Negative.   Psychiatric/Behavioral: Negative.        Objective:    Physical Exam Constitutional:      Appearance: Normal appearance. She is obese.  Cardiovascular:     Rate and Rhythm: Normal rate and regular rhythm.     Pulses: Normal pulses.     Heart sounds: Normal heart sounds.  Pulmonary:     Effort: Pulmonary effort is normal.     Breath sounds: Normal breath sounds.  Musculoskeletal:        General: Normal range of motion.  Neurological:     Mental Status: She is alert.  Psychiatric:  Mood and Affect: Mood normal.        Behavior: Behavior normal.         Thought Content: Thought content normal.        Judgment: Judgment normal.    BP (!) 144/95 (BP Location: Right Arm, Patient Position: Sitting, Cuff Size: Large)   Pulse (!) 104   Temp 98.6 F (37 C) (Oral)   Ht $R'5\' 6"'Wy$  (1.676 m)   Wt 239 lb 1.9 oz (108.5 kg)   SpO2 93%   BMI 38.59 kg/m  Wt Readings from Last 3 Encounters:  07/10/21 239 lb 1.9 oz (108.5 kg)  06/08/21 241 lb (109.3 kg)  12/23/20 246 lb (111.6 kg)    Health Maintenance Due  Topic Date Due   OPHTHALMOLOGY EXAM  Never done   Hepatitis C Screening  Never done   TETANUS/TDAP  Never done   COLONOSCOPY (Pts 45-54yrs Insurance coverage will need to be confirmed)  Never done   FOOT EXAM  06/09/2021    There are no preventive care reminders to display for this patient.   Lab Results  Component Value Date   TSH 2.020 06/09/2021   Lab Results  Component Value Date   WBC 6.6 06/09/2021   HGB 14.9 06/09/2021   HCT 46.8 (H) 06/09/2021   MCV 81 06/09/2021   PLT 240 06/09/2021   Lab Results  Component Value Date   NA 139 06/09/2021   K 4.3 06/09/2021   CO2 26 06/09/2021   GLUCOSE 174 (H) 06/09/2021   BUN 17 06/09/2021   CREATININE 0.65 06/09/2021   BILITOT 0.6 06/09/2021   ALKPHOS 82 06/09/2021   AST 16 06/09/2021   ALT 17 06/09/2021   PROT 7.0 06/09/2021   ALBUMIN 4.8 06/09/2021   CALCIUM 9.9 06/09/2021   ANIONGAP 8 12/20/2020   EGFR 103 06/09/2021   Lab Results  Component Value Date   CHOL 144 06/09/2021   Lab Results  Component Value Date   HDL 51 06/09/2021   Lab Results  Component Value Date   LDLCALC 71 06/09/2021   Lab Results  Component Value Date   TRIG 126 06/09/2021   Lab Results  Component Value Date   CHOLHDL 2.9 09/02/2020   Lab Results  Component Value Date   HGBA1C 6.8 (H) 06/09/2021       Assessment & Plan:   Problem List Items Addressed This Visit       Endocrine   Diabetes mellitus without complication ([place]) - Primary    -she states blood sugars have been  great -INCREASE mounjaro to 5 mg after she finished the 2.5 mg dosages (has 1 left)      Relevant Orders   CMP14+EGFR   CBC with Differential/Platelet   Lipid Panel With LDL/HDL Ratio   Hemoglobin A1c     Other   Hyperlipidemia   Relevant Orders   CMP14+EGFR   CBC with Differential/Platelet   Lipid Panel With LDL/HDL Ratio   Obesity    Wt Readings from Last 3 Encounters:  07/10/21 239 lb 1.9 oz (108.5 kg)  06/08/21 241 lb (109.3 kg)  12/23/20 246 lb (111.6 kg)  -she is down 2 pounds since her last OV -taking mounjaro, so more weight loss is expected      Polycythemia    -unsure of etiology -recheck CBC today -she states her father was diagnosed with polycythemia vera -she is unsure if she has undiagnosed sleep apnea -if RBC continues to climb, will consider sleep study and  hematology consult       Relevant Orders   CBC with Differential/Platelet   CBC with Differential/Platelet     No orders of the defined types were placed in this encounter.    Noreene Larsson, NP

## 2021-07-10 NOTE — Patient Instructions (Addendum)
We will draw a CBC today to recheck your red blood cells.  Please have fasting labs drawn 2-3 days prior to your appointment so we can discuss the results during your office visit.

## 2021-07-10 NOTE — Assessment & Plan Note (Signed)
-  unsure of etiology -recheck CBC today -she states her father was diagnosed with polycythemia vera -she is unsure if she has undiagnosed sleep apnea -if RBC continues to climb, will consider sleep study and hematology consult

## 2021-07-10 NOTE — Assessment & Plan Note (Signed)
-  she states blood sugars have been great -INCREASE mounjaro to 5 mg after she finished the 2.5 mg dosages (has 1 left)

## 2021-07-10 NOTE — Assessment & Plan Note (Signed)
Wt Readings from Last 3 Encounters:  07/10/21 239 lb 1.9 oz (108.5 kg)  06/08/21 241 lb (109.3 kg)  12/23/20 246 lb (111.6 kg)   -she is down 2 pounds since her last OV -taking mounjaro, so more weight loss is expected

## 2021-07-11 ENCOUNTER — Other Ambulatory Visit: Payer: Self-pay | Admitting: Nurse Practitioner

## 2021-07-11 DIAGNOSIS — Z6838 Body mass index (BMI) 38.0-38.9, adult: Secondary | ICD-10-CM

## 2021-07-11 DIAGNOSIS — D751 Secondary polycythemia: Secondary | ICD-10-CM

## 2021-07-11 LAB — CBC WITH DIFFERENTIAL/PLATELET
Basophils Absolute: 0 10*3/uL (ref 0.0–0.2)
Basos: 1 %
EOS (ABSOLUTE): 0.1 10*3/uL (ref 0.0–0.4)
Eos: 2 %
Hematocrit: 44.6 % (ref 34.0–46.6)
Hemoglobin: 14.5 g/dL (ref 11.1–15.9)
Immature Grans (Abs): 0 10*3/uL (ref 0.0–0.1)
Immature Granulocytes: 0 %
Lymphocytes Absolute: 2.5 10*3/uL (ref 0.7–3.1)
Lymphs: 34 %
MCH: 26.3 pg — ABNORMAL LOW (ref 26.6–33.0)
MCHC: 32.5 g/dL (ref 31.5–35.7)
MCV: 81 fL (ref 79–97)
Monocytes Absolute: 0.5 10*3/uL (ref 0.1–0.9)
Monocytes: 7 %
Neutrophils Absolute: 4.2 10*3/uL (ref 1.4–7.0)
Neutrophils: 56 %
Platelets: 246 10*3/uL (ref 150–450)
RBC: 5.51 x10E6/uL — ABNORMAL HIGH (ref 3.77–5.28)
RDW: 14.6 % (ref 11.7–15.4)
WBC: 7.4 10*3/uL (ref 3.4–10.8)

## 2021-07-11 NOTE — Progress Notes (Signed)
RBC is still elevated. Not as high as it was last time, but high enough that we can get the home sleep study and the hematology referral.

## 2021-07-20 ENCOUNTER — Telehealth: Payer: Self-pay | Admitting: Nurse Practitioner

## 2021-07-20 NOTE — Telephone Encounter (Signed)
Patient called in seeking a new saving card for her [facility], current card isn't working and the printed one offline isn't working either. Wants faxed to [facility] [employer].  Fax # [phone]

## 2021-07-21 NOTE — Telephone Encounter (Signed)
Faxed to [facility]

## 2021-07-26 NOTE — Progress Notes (Signed)
[facility] [address], [place] [address]   CLINIC:  Medical Oncology/Hematology  Patient Care Team: Noreene Larsson, NP as PCP - General (Nurse Practitioner) Derek Jack, MD as Medical Oncologist (Hematology)  CHIEF COMPLAINTS/PURPOSE OF CONSULTATION:  Evaluation of polycythemia  HISTORY OF PRESENTING ILLNESS:  [name] 55 y.o. female is here because of evaluation of polycythemia, at the request of [facility].  Today she reports feeling good. She donates blood twice a year. She denies itching following taking a shower, changing colors of fingers, rash, fever, night sweats, weight loss, and history of blood clots, MI, CVA. She is unaware of a history of sleep apnea. She reports occasional napping during the day, but denies severe fatigue. She denies severely disrupted sleep although she does report waking 1-2 times during the night. She currently lives at home with her family, and she is able to do all of her typical activities. 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 She denies smoking history. Her father had polycythemia vera and a history of blood clots; he passed in his 68's. Her mother had stage I breast cancer; her maternal uncle had rectal cancer, and her maternal aunt had breast and ovarian cancer. Her paternal aunt passed form kidney cancer, and another paternal aunt had vulva cancer.   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   Obesity 03/30/2020   Wears glasses     SURGICAL HISTORY: Past Surgical History:  Procedure Laterality Date   ABDOMINAL HYSTERECTOMY  several yrs ago   partial   BREAST SURGERY  yrs ago   biopsy    DILATION AND CURETTAGE OF UTERUS     LAPAROSCOPIC BILATERAL SALPINGO OOPHERECTOMY Bilateral 12/23/2020   Procedure: LAPAROSCOPIC BILATERAL SALPINGO OOPHORECTOMY;  Surgeon: Cheri Fowler, MD;  Location: [facility];  Service: Gynecology;  Laterality: Bilateral;   NASAL SINUS SURGERY  2006   deviated septum    SOCIAL HISTORY: Social History   Socioeconomic History   Marital status: Married    Spouse name: Scientist, physiological    Number of children: 1   Years of education: Not on file   Highest education level: 12th grade  Occupational History   Not on file  Tobacco Use   Smoking status: Never   Smokeless tobacco: Never  Vaping Use   Vaping Use: Never used  Substance and Sexual Activity   Alcohol use: No   Drug use: No   Sexual activity: Yes    Comment: hysterectomy  Other Topics Concern   Not on file  Social History Narrative   Lives with husband [name] of 24 years.   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   Weapons in lock box     Social Determinants of Health   Financial Resource Strain: Not on file  Food Insecurity: Not on file  Transportation Needs: Not on file  Physical Activity: Not on file  Stress: Not on file  Social Connections: Not on file  Intimate Partner Violence: Not on file    FAMILY HISTORY: Family History  Problem Relation Age of Onset   Hypertension Mother    Breast cancer Mother 5   Polycythemia Father     Pancreatic cancer Father    Lung cancer Brother        smoker   Breast cancer Maternal Aunt        dx over 39   Ovarian cancer Maternal Aunt    Colon cancer Maternal Uncle        dx over 95   Cancer Paternal Aunt        Vulvar cancer   Stroke Maternal Grandmother    Prostate cancer Maternal Grandfather    Pancreatic cancer Paternal Grandmother    Polycythemia Brother    Kidney cancer Paternal Aunt        d. late 30s-early 51s    ALLERGIES:  is allergic to other and cefoxitin.  MEDICATIONS:  Current Outpatient Medications  Medication Sig Dispense Refill   Ascorbic Acid (VITAMIN C WITH ROSE HIPS) 500 MG tablet Take 500 mg by mouth daily.     atorvastatin (LIPITOR) 10 MG tablet TAKE ONE TABLET (10MG TOTAL) BY MOUTH DAILY 90 tablet 0   cetirizine (ZYRTEC) 10 MG tablet Take 10 mg by mouth daily.     esomeprazole (NEXIUM) 20 MG capsule Take 20 mg by mouth daily at 12 noon.     fluticasone (FLONASE) 50 MCG/ACT nasal spray Place 2 sprays into both nostrils daily. 16 g 6   loratadine (CLARITIN) 10 MG tablet Take 10 mg by mouth daily.     losartan-hydrochlorothiazide (HYZAAR) 50-12.5 MG tablet TAKE ONE (1) TABLET BY MOUTH EVERY DAY 90 tablet 3   metFORMIN (GLUCOPHAGE) 500 MG tablet TAKE TWO (2) TABLETS BY MOUTH 2 TIMES DAILY 360 tablet 0   olopatadine (PATANOL) 0.1 % ophthalmic solution Place 1 drop into the left eye 2 (two) times daily. 5 mL 12   Omega-3 Fatty Acids (FISH OIL) 1200 MG CAPS Take 1 capsule by mouth daily.     OVER THE COUNTER MEDICATION Take 2 tablets by mouth daily. Womens Mutilvitamin     OVER THE COUNTER MEDICATION Querctin 500 mg dialy     tirzepatide (MOUNJARO) 2.5 MG/0.5ML Pen Inject 2.5 mg into the skin once a week. 2 mL 0   tirzepatide (MOUNJARO) 5 MG/0.5ML Pen Inject 5 mg into the skin once a week. 6 mL 0   UNABLE TO FIND Med Name: vit d 3 2000 iu daily     UNKNOWN TO PATIENT Maintenance powder inhaler     vitamin E 180 MG (400 UNITS) capsule Take 400 Units by  mouth daily.     zinc gluconate 50 MG tablet Take 50 mg by mouth daily.     albuterol (PROVENTIL HFA;VENTOLIN HFA) 108 (90 BASE) MCG/ACT inhaler Inhale 1 puff into the lungs every 6 (six) hours as needed. As  needed for asthma. (Patient not taking: Reported on 07/27/2021)     SUMAtriptan (IMITREX) 100 MG tablet Take 1 tablet (100 mg total) by mouth every 2 (two) hours as needed. (Patient not taking: Reported on 07/27/2021) 10 tablet 2   No current facility-administered medications for this visit.    REVIEW OF SYSTEMS:   Review of Systems  Constitutional:  Negative for appetite change, fatigue, fever and unexpected weight change.  Skin:  Negative for itching and rash.  Psychiatric/Behavioral:  Negative for depression.   All other systems reviewed and are negative.   PHYSICAL EXAMINATION: ECOG PERFORMANCE STATUS: 0 - Asymptomatic  Vitals:   07/27/21 1322  BP: (!) 169/97  Pulse: 97  Resp: 18  Temp: 98.2 F (36.8 C)  SpO2: 97%   Filed Weights   07/27/21 1322  Weight: 241 lb 3.2 oz (109.4 kg)   Physical Exam Vitals reviewed.  Constitutional:      Appearance: Normal appearance.  Cardiovascular:     Rate and Rhythm: Normal rate and regular rhythm.     Pulses: Normal pulses.     Heart sounds: Normal heart sounds.  Pulmonary:     Effort: Pulmonary effort is normal.     Breath sounds: Normal breath sounds.  Abdominal:     Palpations: Abdomen is soft. There is no hepatomegaly, splenomegaly or mass.     Tenderness: There is no abdominal tenderness.  Musculoskeletal:     Right lower leg: No edema.     Left lower leg: No edema.  Lymphadenopathy:     Cervical: No cervical adenopathy.     Right cervical: No superficial cervical adenopathy.    Left cervical: No superficial cervical adenopathy.     Upper Body:     Right upper body: No supraclavicular, axillary or pectoral adenopathy.     Left upper body: No supraclavicular, axillary or pectoral adenopathy.     Lower Body: No  right inguinal adenopathy. No left inguinal adenopathy.  Neurological:     General: No focal deficit present.     Mental Status: She is alert and oriented to person, place, and time.  Psychiatric:        Mood and Affect: Mood normal.        Behavior: Behavior normal.     LABORATORY DATA:  I have reviewed the data as listed Recent Results (from the past 2160 hour(s))  CBC with Differential/Platelet     Status: Abnormal   Collection Time: 06/09/21 11:16 AM  Result Value Ref Range   WBC 6.6 3.4 - 10.8 x10E3/uL   RBC 5.78 (H) 3.77 - 5.28 x10E6/uL   Hemoglobin 14.9 11.1 - 15.9 g/dL   Hematocrit 46.8 (H) 34.0 - 46.6 %   MCV 81 79 - 97 fL   MCH 25.8 (L) 26.6 - 33.0 pg   MCHC 31.8 31.5 - 35.7 g/dL   RDW 14.5 11.7 - 15.4 %   Platelets 240 150 - 450 x10E3/uL   Neutrophils 60 Not Estab. %   Lymphs 30 Not Estab. %   Monocytes 7 Not Estab. %   Eos 2 Not Estab. %   Basos 1 Not Estab. %   Neutrophils Absolute 4.0 1.4 - 7.0 x10E3/uL   Lymphocytes Absolute 2.0 0.7 - 3.1 x10E3/uL   Monocytes Absolute 0.5 0.1 - 0.9 x10E3/uL   EOS (ABSOLUTE) 0.1 0.0 - 0.4 x10E3/uL   Basophils Absolute 0.0 0.0 - 0.2 x10E3/uL   Immature Granulocytes 0 Not Estab. %   Immature Grans (Abs) 0.0  0.0 - 0.1 x10E3/uL  CMP14+EGFR     Status: Abnormal   Collection Time: 06/09/21 11:16 AM  Result Value Ref Range   Glucose 174 (H) 65 - 99 mg/dL   BUN 17 6 - 24 mg/dL   Creatinine, Ser 0.65 0.57 - 1.00 mg/dL   eGFR 103 >59 mL/min/1.73   BUN/Creatinine Ratio 26 (H) 9 - 23   Sodium 139 134 - 144 mmol/L   Potassium 4.3 3.5 - 5.2 mmol/L   Chloride 98 96 - 106 mmol/L   CO2 26 20 - 29 mmol/L   Calcium 9.9 8.7 - 10.2 mg/dL   Total Protein 7.0 6.0 - 8.5 g/dL   Albumin 4.8 3.8 - 4.9 g/dL   Globulin, Total 2.2 1.5 - 4.5 g/dL   Albumin/Globulin Ratio 2.2 1.2 - 2.2   Bilirubin Total 0.6 0.0 - 1.2 mg/dL   Alkaline Phosphatase 82 44 - 121 IU/L   AST 16 0 - 40 IU/L   ALT 17 0 - 32 IU/L  Lipid Panel With LDL/HDL Ratio      Status: None   Collection Time: 06/09/21 11:16 AM  Result Value Ref Range   Cholesterol, Total 144 100 - 199 mg/dL   Triglycerides 126 0 - 149 mg/dL   HDL 51 >39 mg/dL   VLDL Cholesterol Cal 22 5 - 40 mg/dL   LDL Chol Calc (NIH) 71 0 - 99 mg/dL   LDL/HDL Ratio 1.4 0.0 - 3.2 ratio    Comment:                                     LDL/HDL Ratio                                             Men  Women                               1/2 Avg.Risk  1.0    1.5                                   Avg.Risk  3.6    3.2                                2X Avg.Risk  6.2    5.0                                3X Avg.Risk  8.0    6.1   Hemoglobin A1c     Status: Abnormal   Collection Time: 06/09/21 11:16 AM  Result Value Ref Range   Hgb A1c MFr Bld 6.8 (H) 4.8 - 5.6 %    Comment:          Prediabetes: 5.7 - 6.4          Diabetes: >6.4          Glycemic control for adults with diabetes: <7.0    Est. average glucose Bld gHb Est-mCnc 148 mg/dL  TSH     Status: None   Collection Time: 06/09/21 11:16  AM  Result Value Ref Range   TSH 2.020 0.450 - 4.500 uIU/mL  CBC with Differential/Platelet     Status: Abnormal   Collection Time: 07/10/21  3:28 PM  Result Value Ref Range   WBC 7.4 3.4 - 10.8 x10E3/uL   RBC 5.51 (H) 3.77 - 5.28 x10E6/uL   Hemoglobin 14.5 11.1 - 15.9 g/dL   Hematocrit 44.6 34.0 - 46.6 %   MCV 81 79 - 97 fL   MCH 26.3 (L) 26.6 - 33.0 pg   MCHC 32.5 31.5 - 35.7 g/dL   RDW 14.6 11.7 - 15.4 %   Platelets 246 150 - 450 x10E3/uL   Neutrophils 56 Not Estab. %   Lymphs 34 Not Estab. %   Monocytes 7 Not Estab. %   Eos 2 Not Estab. %   Basos 1 Not Estab. %   Neutrophils Absolute 4.2 1.4 - 7.0 x10E3/uL   Lymphocytes Absolute 2.5 0.7 - 3.1 x10E3/uL   Monocytes Absolute 0.5 0.1 - 0.9 x10E3/uL   EOS (ABSOLUTE) 0.1 0.0 - 0.4 x10E3/uL   Basophils Absolute 0.0 0.0 - 0.2 x10E3/uL   Immature Granulocytes 0 Not Estab. %   Immature Grans (Abs) 0.0 0.0 - 0.1 x10E3/uL    RADIOGRAPHIC STUDIES: I  have personally reviewed the radiological images as listed and agreed with the findings in the report. No results found.  ASSESSMENT:  Elevated RBC count: - She was seen for evaluation of elevated RBC count. - CBC on 07/10/2021 with RBC elevated at 5.51, normal hemoglobin and hematocrit, normal white count and platelet count. - Review of CBC or last few years showed elevated RBC count since May 2013. - She tells that she has been on Hyzaar for a little over 6 years. - She donated blood more than a year ago, used to donate twice per year. - Denies any aquagenic pruritus or erythromelalgia's.  No prior history of DVT.  No MI/CVA. - Denies any excessive daytime tiredness.  Takes occasional naps during daytime. - Non-smoker.  No secondhand smoke exposure.  2.  Social/family history: - She lives at home with her husband.  She used to work as a Network engineer at [employer].  Non-smoker. - Father had?  Polycythemia vera.  Mother had early stage breast cancer.  Maternal uncle had colorectal cancer.  Maternal aunt had breast and ovarian cancers.  Paternal aunt had vulvar cancer.  Another paternal aunt had kidney cancer.   PLAN:  Elevated RBC count: - She has persistently elevated RBC count since 2013. - She thinks she might have been started on Hyzaar around that time. - No clear clinical signs or symptoms suspicious for sleep apnea. - We will repeat CBC.  We will check serum erythropoietin.  If RBC count continues to be elevated, will consider checking JAK2 [id number] mutation. - RTC 2 to 3 weeks to discuss results.   All questions were answered. The patient knows to call the clinic with any problems, questions or concerns.  Derek Jack, MD 07/27/21 1:53 PM  [facility] [place] [phone]   I, Thana Ates, am acting as a scribe for Dr. Derek Jack.  I, Derek Jack MD, have reviewed the above documentation for accuracy and completeness, and I agree with the above.

## 2021-07-27 ENCOUNTER — Inpatient Hospital Stay (HOSPITAL_COMMUNITY): Payer: 59 | Attending: Hematology | Admitting: Hematology

## 2021-07-27 ENCOUNTER — Inpatient Hospital Stay (HOSPITAL_COMMUNITY): Payer: 59

## 2021-07-27 ENCOUNTER — Other Ambulatory Visit: Payer: Self-pay

## 2021-07-27 VITALS — BP 169/97 | HR 97 | Temp 98.2°F | Resp 18 | Ht 66.0 in | Wt 241.2 lb

## 2021-07-27 DIAGNOSIS — E119 Type 2 diabetes mellitus without complications: Secondary | ICD-10-CM | POA: Diagnosis not present

## 2021-07-27 DIAGNOSIS — R718 Other abnormality of red blood cells: Secondary | ICD-10-CM | POA: Diagnosis not present

## 2021-07-27 DIAGNOSIS — Z1509 Genetic susceptibility to other malignant neoplasm: Secondary | ICD-10-CM | POA: Diagnosis not present

## 2021-07-27 DIAGNOSIS — Z803 Family history of malignant neoplasm of breast: Secondary | ICD-10-CM | POA: Diagnosis not present

## 2021-07-27 DIAGNOSIS — E785 Hyperlipidemia, unspecified: Secondary | ICD-10-CM | POA: Insufficient documentation

## 2021-07-27 DIAGNOSIS — Z8049 Family history of malignant neoplasm of other genital organs: Secondary | ICD-10-CM | POA: Diagnosis not present

## 2021-07-27 DIAGNOSIS — Z7984 Long term (current) use of oral hypoglycemic drugs: Secondary | ICD-10-CM | POA: Insufficient documentation

## 2021-07-27 DIAGNOSIS — D751 Secondary polycythemia: Secondary | ICD-10-CM | POA: Insufficient documentation

## 2021-07-27 DIAGNOSIS — Z90722 Acquired absence of ovaries, bilateral: Secondary | ICD-10-CM | POA: Insufficient documentation

## 2021-07-27 DIAGNOSIS — Z8051 Family history of malignant neoplasm of kidney: Secondary | ICD-10-CM | POA: Diagnosis not present

## 2021-07-27 DIAGNOSIS — E669 Obesity, unspecified: Secondary | ICD-10-CM | POA: Insufficient documentation

## 2021-07-27 DIAGNOSIS — Z800 Family history of malignant neoplasm of digestive organs: Secondary | ICD-10-CM | POA: Insufficient documentation

## 2021-07-27 DIAGNOSIS — I10 Essential (primary) hypertension: Secondary | ICD-10-CM | POA: Insufficient documentation

## 2021-07-27 DIAGNOSIS — Z823 Family history of stroke: Secondary | ICD-10-CM | POA: Diagnosis not present

## 2021-07-27 DIAGNOSIS — Z801 Family history of malignant neoplasm of trachea, bronchus and lung: Secondary | ICD-10-CM | POA: Insufficient documentation

## 2021-07-27 DIAGNOSIS — Z8249 Family history of ischemic heart disease and other diseases of the circulatory system: Secondary | ICD-10-CM | POA: Insufficient documentation

## 2021-07-27 DIAGNOSIS — Z8041 Family history of malignant neoplasm of ovary: Secondary | ICD-10-CM | POA: Diagnosis not present

## 2021-07-27 LAB — CBC WITH DIFFERENTIAL/PLATELET
Abs Immature Granulocytes: 0.02 10*3/uL (ref 0.00–0.07)
Basophils Absolute: 0 10*3/uL (ref 0.0–0.1)
Basophils Relative: 1 %
Eosinophils Absolute: 0.1 10*3/uL (ref 0.0–0.5)
Eosinophils Relative: 2 %
HCT: 45.9 % (ref 36.0–46.0)
Hemoglobin: 13.9 g/dL (ref 12.0–15.0)
Immature Granulocytes: 0 %
Lymphocytes Relative: 33 %
Lymphs Abs: 2.1 10*3/uL (ref 0.7–4.0)
MCH: 26.2 pg (ref 26.0–34.0)
MCHC: 30.3 g/dL (ref 30.0–36.0)
MCV: 86.4 fL (ref 80.0–100.0)
Monocytes Absolute: 0.4 10*3/uL (ref 0.1–1.0)
Monocytes Relative: 6 %
Neutro Abs: 3.7 10*3/uL (ref 1.7–7.7)
Neutrophils Relative %: 58 %
Platelets: 222 10*3/uL (ref 150–400)
RBC: 5.31 MIL/uL — ABNORMAL HIGH (ref 3.87–5.11)
RDW: 15 % (ref 11.5–15.5)
WBC: 6.3 10*3/uL (ref 4.0–10.5)
nRBC: 0 % (ref 0.0–0.2)

## 2021-07-27 LAB — RETICULOCYTES
Immature Retic Fract: 11 % (ref 2.3–15.9)
RBC.: 5.15 MIL/uL — ABNORMAL HIGH (ref 3.87–5.11)
Retic Count, Absolute: 71.1 10*3/uL (ref 19.0–186.0)
Retic Ct Pct: 1.4 % (ref 0.4–3.1)

## 2021-07-27 LAB — LACTATE DEHYDROGENASE: LDH: 118 U/L (ref 98–192)

## 2021-07-27 NOTE — Patient Instructions (Addendum)
[facility] Discharge Instructions  You were seen and examined today by Dr. Delton Coombes. Dr. Delton Coombes is a hematologist, meaning that he specializes in the management of blood disorders. Dr. Delton Coombes discussed your past medical history, family history of blood disorders/cancers, and the events that led to you being here today.  Your PCP referred you to Dr. Delton Coombes due to elevated red blood cells. Dr. Delton Coombes recommended additional lab work today to recheck your red blood cells and attempt to identify the cause of your elevated red blood cells.  Follow-up as scheduled.   Thank you for choosing [facility] to provide your oncology and hematology care.  To afford each patient quality time with our provider, please arrive at least 15 minutes before your scheduled appointment time.   If you have a lab appointment with the [facility] please come in thru the Main Entrance and check in at the main information desk.  You need to re-schedule your appointment should you arrive 10 or more minutes late.  We strive to give you quality time with our providers, and arriving late affects you and other patients whose appointments are after yours.  Also, if you no show three or more times for appointments you may be dismissed from the clinic at the providers discretion.     Again, thank you for choosing [facility].  Our hope is that these requests will decrease the amount of time that you wait before being seen by our physicians.       _____________________________________________________________  Should you have questions after your visit to [facility], please contact our office at [phone] and follow the prompts.  Our office hours are 8:00 a.m. and 4:30 p.m. Monday - Friday.  Please note that voicemails left after 4:00 p.m. may not be returned until the following business day.  We are closed  weekends and major holidays.  You do have access to a nurse 24-7, just call the main number to the clinic [phone] and do not press any options, hold on the line and a nurse will answer the phone.    For prescription refill requests, have your pharmacy contact our office and allow 72 hours.    Due to Covid, you will need to wear a mask upon entering the hospital. If you do not have a mask, a mask will be given to you at the Main Entrance upon arrival. For doctor visits, patients may have 1 support person age 30 or older with them. For treatment visits, patients can not have anyone with them due to social distancing guidelines and our immunocompromised population.

## 2021-07-29 LAB — ERYTHROPOIETIN: Erythropoietin: 11.6 m[IU]/mL (ref 2.6–18.5)

## 2021-08-08 LAB — JAK2 V617F, W REFLEX TO CALR/E12/MPL

## 2021-08-08 LAB — CALR + JAK2 E12-15 + MPL (REFLEXED)

## 2021-08-19 NOTE — Progress Notes (Signed)
[facility] [address], [place] [address]   CLINIC:  Medical Oncology/Hematology  PCP:  Heather Roberts, NP [address]  [address] / [place] [address]  [phone]  REASON FOR VISIT:  Follow-up for polycythemia  PRIOR THERAPY: none  CURRENT THERAPY: under work-up  INTERVAL HISTORY:  Ms. [name], a 55 y.o. female, returns for routine follow-up for her polycythemia. Lilianah was last seen on 07/27/2021.  Today she reports feeling good. She denies family history of calcemia. She denies itching after showers.   REVIEW OF SYSTEMS:  Review of Systems  Constitutional:  Negative for appetite change and fatigue (75%).  Skin:  Negative for itching.  All other systems reviewed and are negative.  PAST MEDICAL/SURGICAL HISTORY:  Past Medical History:  Diagnosis Date   Allergy    Asthma    Asthma    Phreesia 03/27/2020   Breast calcification, left 02/25/2012   Controlled type 2 diabetes mellitus without complication (HCC)    Depression 11/12/2019   Family history of breast cancer    Family history of colon cancer    Family history of kidney cancer    Family history of ovarian cancer    Family history of pancreatic cancer    Female stress incontinence 06/09/2020   GERD (gastroesophageal reflux disease)    Headache(784.0)    migraines occ   Hypertension    Menopausal symptom 06/09/2020   Mutation in BRIP1 gene 07/05/2020   Obesity 03/30/2020   Wears glasses    Past Surgical History:  Procedure Laterality Date   ABDOMINAL HYSTERECTOMY  several yrs ago   partial   BREAST SURGERY  yrs ago   biopsy    DILATION AND CURETTAGE OF UTERUS     LAPAROSCOPIC BILATERAL SALPINGO OOPHERECTOMY Bilateral 12/23/2020   Procedure: LAPAROSCOPIC BILATERAL SALPINGO OOPHORECTOMY;  Surgeon: Lavina Hamman, MD;  Location: [facility];  Service: Gynecology;  Laterality: Bilateral;   NASAL SINUS SURGERY  2006   deviated septum    SOCIAL  HISTORY:  Social History   Socioeconomic History   Marital status: Married    Spouse name: Public house manager    Number of children: 1   Years of education: Not on file   Highest education level: 12th grade  Occupational History   Not on file  Tobacco Use   Smoking status: Never   Smokeless tobacco: Never  Vaping Use   Vaping Use: Never used  Substance and Sexual Activity   Alcohol use: No   Drug use: No   Sexual activity: Yes    Comment: hysterectomy  Other Topics Concern   Not on file  Social History Narrative   Lives with husband August Saucer of 30 years.   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   Weapons in lock box     Social Determinants of Health   Financial Resource Strain: Not on file  Food Insecurity: Not on file  Transportation Needs: Not on file  Physical Activity: Not on file  Stress: Not on file  Social Connections: Not on file  Intimate Partner  Violence: Not on file    FAMILY HISTORY:  Family History  Problem Relation Age of Onset   Hypertension Mother    Breast cancer Mother 62   Polycythemia Father    Pancreatic cancer Father    Lung cancer Brother        smoker   Breast cancer Maternal Aunt        dx over 2   Ovarian cancer Maternal Aunt    Colon cancer Maternal Uncle        dx over 62   Cancer Paternal Aunt        Vulvar cancer   Stroke Maternal Grandmother    Prostate cancer Maternal Grandfather    Pancreatic cancer Paternal Grandmother    Polycythemia Brother    Kidney cancer Paternal Aunt        d. late 30s-early 30s    CURRENT MEDICATIONS:  Current Outpatient Medications  Medication Sig Dispense Refill   albuterol (PROVENTIL HFA;VENTOLIN HFA) 108 (90 BASE) MCG/ACT inhaler Inhale 1 puff into the lungs  every 6 (six) hours as needed. As needed for asthma. (Patient not taking: Reported on 07/27/2021)     Ascorbic Acid (VITAMIN C WITH ROSE HIPS) 500 MG tablet Take 500 mg by mouth daily.     atorvastatin (LIPITOR) 10 MG tablet TAKE ONE TABLET ($RemoveBef'10MG'ajDIIABsqR$  TOTAL) BY MOUTH DAILY 90 tablet 0   cetirizine (ZYRTEC) 10 MG tablet Take 10 mg by mouth daily.     esomeprazole (NEXIUM) 20 MG capsule Take 20 mg by mouth daily at 12 noon.     fluticasone (FLONASE) 50 MCG/ACT nasal spray Place 2 sprays into both nostrils daily. 16 g 6   loratadine (CLARITIN) 10 MG tablet Take 10 mg by mouth daily.     losartan-hydrochlorothiazide (HYZAAR) 50-12.5 MG tablet TAKE ONE (1) TABLET BY MOUTH EVERY DAY 90 tablet 3   metFORMIN (GLUCOPHAGE) 500 MG tablet TAKE TWO (2) TABLETS BY MOUTH 2 TIMES DAILY 360 tablet 0   olopatadine (PATANOL) 0.1 % ophthalmic solution Place 1 drop into the left eye 2 (two) times daily. 5 mL 12   Omega-3 Fatty Acids (FISH OIL) 1200 MG CAPS Take 1 capsule by mouth daily.     OVER THE COUNTER MEDICATION Take 2 tablets by mouth daily. Womens Mutilvitamin     OVER THE COUNTER MEDICATION Querctin 500 mg dialy     SUMAtriptan (IMITREX) 100 MG tablet Take 1 tablet (100 mg total) by mouth every 2 (two) hours as needed. (Patient not taking: Reported on 07/27/2021) 10 tablet 2   tirzepatide (MOUNJARO) 2.5 MG/0.5ML Pen Inject 2.5 mg into the skin once a week. 2 mL 0   tirzepatide (MOUNJARO) 5 MG/0.5ML Pen Inject 5 mg into the skin once a week. 6 mL 0   UNABLE TO FIND Med Name: vit d 3 2000 iu daily     UNKNOWN TO PATIENT Maintenance powder inhaler     vitamin E 180 MG (400 UNITS) capsule Take 400 Units by mouth daily.     zinc gluconate 50 MG tablet Take 50 mg by mouth daily.     No current facility-administered medications for this visit.    ALLERGIES:  Allergies  Allergen Reactions   Other Hives and Itching   Cefoxitin Hives and Itching    PHYSICAL EXAM:  Performance status (ECOG): 0 -  Asymptomatic  There were no vitals filed for this visit. Wt Readings from Last 3 Encounters:  07/27/21 241 lb 3.2 oz (109.4 kg)  07/10/21  239 lb 1.9 oz (108.5 kg)  06/08/21 241 lb (109.3 kg)   Physical Exam Vitals reviewed.  Constitutional:      Appearance: Normal appearance.  Cardiovascular:     Rate and Rhythm: Normal rate and regular rhythm.     Pulses: Normal pulses.     Heart sounds: Normal heart sounds.  Pulmonary:     Effort: Pulmonary effort is normal.     Breath sounds: Normal breath sounds.  Neurological:     General: No focal deficit present.     Mental Status: She is alert and oriented to person, place, and time.  Psychiatric:        Mood and Affect: Mood normal.        Behavior: Behavior normal.    LABORATORY DATA:  I have reviewed the labs as listed.  CBC Latest Ref Rng & Units 07/27/2021 07/10/2021 06/09/2021  WBC 4.0 - 10.5 K/uL 6.3 7.4 6.6  Hemoglobin 12.0 - 15.0 g/dL 13.9 14.5 14.9  Hematocrit 36.0 - 46.0 % 45.9 44.6 46.8(H)  Platelets 150 - 400 K/uL 222 246 240   CMP Latest Ref Rng & Units 06/09/2021 12/20/2020 09/02/2020  Glucose 65 - 99 mg/dL 174(H) 147(H) 162(H)  BUN 6 - 24 mg/dL 17 25(H) 17  Creatinine 0.57 - 1.00 mg/dL 0.65 0.66 0.66  Sodium 134 - 144 mmol/L 139 139 142  Potassium 3.5 - 5.2 mmol/L 4.3 4.7 4.0  Chloride 96 - 106 mmol/L 98 101 102  CO2 20 - 29 mmol/L $RemoveB'26 30 27  'ugFvSyRd$ Calcium 8.7 - 10.2 mg/dL 9.9 10.3 9.5  Total Protein 6.0 - 8.5 g/dL 7.0 7.9 6.9  Total Bilirubin 0.0 - 1.2 mg/dL 0.6 0.7 0.6  Alkaline Phos 44 - 121 IU/L 82 71 75  AST 0 - 40 IU/L $Remov'16 23 19  'hLSYTz$ ALT 0 - 32 IU/L $Remov'17 23 21      'VOnqYn$ Component Value Date/Time   RBC 5.15 (H) 07/27/2021 1433   RBC 5.31 (H) 07/27/2021 1433   MCV 86.4 07/27/2021 1433   MCV 81 07/10/2021 1528   MCH 26.2 07/27/2021 1433   MCHC 30.3 07/27/2021 1433   RDW 15.0 07/27/2021 1433   RDW 14.6 07/10/2021 1528   LYMPHSABS 2.1 07/27/2021 1433   LYMPHSABS 2.5 07/10/2021 1528   MONOABS 0.4 07/27/2021 1433   EOSABS  0.1 07/27/2021 1433   EOSABS 0.1 07/10/2021 1528   BASOSABS 0.0 07/27/2021 1433   BASOSABS 0.0 07/10/2021 1528    DIAGNOSTIC IMAGING:  I have independently reviewed the scans and discussed with the patient. No results found.   ASSESSMENT:  Elevated RBC count: - She was seen for evaluation of elevated RBC count. - CBC on 07/10/2021 with RBC elevated at 5.51, normal hemoglobin and hematocrit, normal white count and platelet count. - Review of CBC or last few years showed elevated RBC count since May 2013. - She tells that she has been on Hyzaar for a little over 6 years. - She donated blood more than a year ago, used to donate twice per year. - Denies any aquagenic pruritus or erythromelalgia's.  No prior history of DVT.  No MI/CVA. - Denies any excessive daytime tiredness.  Takes occasional naps during daytime. - Non-smoker.  No secondhand smoke exposure.  2.  Social/family history: - She lives at home with her husband.  She used to work as a Network engineer at [employer].  Non-smoker. - Father had?  Polycythemia vera.  Mother had early stage breast cancer.  Maternal uncle had colorectal cancer.  Maternal aunt had breast and ovarian cancers.  Paternal aunt had vulvar cancer.  Another paternal aunt had kidney cancer.   PLAN:  JAK2 V617F negative elevated RBC count: -I have reviewed labs from 07/27/2021.  RBC count is 5.31 with slight improvement from the last 2 months.  Serum EPO level was normal at 11.6.  LDH was normal. - JAK2 V617F and reflex mutation testing was negative ruling out myeloproliferative disorder. - RTC 6 months for follow-up with repeat CBC.  Orders placed this encounter:  No orders of the defined types were placed in this encounter.    Derek Jack, MD [place] [phone]   I, Thana Ates, am acting as a scribe for Dr. Derek Jack.  I, Derek Jack MD, have reviewed the above documentation for accuracy and completeness, and I  agree with the above.

## 2021-08-21 ENCOUNTER — Other Ambulatory Visit: Payer: Self-pay

## 2021-08-21 ENCOUNTER — Inpatient Hospital Stay (HOSPITAL_COMMUNITY): Payer: 59 | Attending: Hematology | Admitting: Hematology

## 2021-08-21 VITALS — BP 120/86 | HR 101 | Temp 98.5°F | Resp 18 | Wt 235.4 lb

## 2021-08-21 DIAGNOSIS — D751 Secondary polycythemia: Secondary | ICD-10-CM | POA: Diagnosis not present

## 2021-08-21 NOTE — Patient Instructions (Signed)
[facility] Discharge Instructions  You were seen and examined today by Dr. Katragadda. Please follow up as scheduled.    Thank you for choosing [facility] at [facility] to provide your oncology and hematology care.  To afford each patient quality time with our provider, please arrive at least 15 minutes before your scheduled appointment time.   If you have a lab appointment with the Cancer Center please come in thru the Main Entrance and check in at the main information desk.  You need to re-schedule your appointment should you arrive 10 or more minutes late.  We strive to give you quality time with our providers, and arriving late affects you and other patients whose appointments are after yours.  Also, if you no show three or more times for appointments you may be dismissed from the clinic at the providers discretion.     Again, thank you for choosing [facility].  Our hope is that these requests will decrease the amount of time that you wait before being seen by our physicians.       _____________________________________________________________  Should you have questions after your visit to  Cancer Center, please contact our office at [phone] and follow the prompts.  Our office hours are 8:00 a.m. and 4:30 p.m. Monday - Friday.  Please note that voicemails left after 4:00 p.m. may not be returned until the following business day.  We are closed weekends and major holidays.  You do have access to a nurse 24-7, just call the main number to the clinic [phone] and do not press any options, hold on the line and a nurse will answer the phone.    For prescription refill requests, have your pharmacy contact our office and allow 72 hours.    Due to Covid, you will need to wear a mask upon entering the hospital. If you do not have a mask, a mask will be given to you at the Main Entrance upon arrival. For  doctor visits, patients may have 1 support person age 18 or older with them. For treatment visits, patients can not have anyone with them due to social distancing guidelines and our immunocompromised population.      

## 2021-08-29 ENCOUNTER — Other Ambulatory Visit: Payer: Self-pay | Admitting: Nurse Practitioner

## 2021-08-29 DIAGNOSIS — E782 Mixed hyperlipidemia: Secondary | ICD-10-CM

## 2021-08-29 DIAGNOSIS — E119 Type 2 diabetes mellitus without complications: Secondary | ICD-10-CM

## 2021-09-01 ENCOUNTER — Other Ambulatory Visit: Payer: Self-pay

## 2021-09-01 ENCOUNTER — Encounter: Payer: Self-pay | Admitting: Nurse Practitioner

## 2021-09-01 ENCOUNTER — Ambulatory Visit: Payer: 59 | Admitting: Nurse Practitioner

## 2021-09-01 ENCOUNTER — Ambulatory Visit: Payer: 59

## 2021-09-01 DIAGNOSIS — R051 Acute cough: Secondary | ICD-10-CM

## 2021-09-01 DIAGNOSIS — J029 Acute pharyngitis, unspecified: Secondary | ICD-10-CM | POA: Diagnosis not present

## 2021-09-01 DIAGNOSIS — B950 Streptococcus, group A, as the cause of diseases classified elsewhere: Secondary | ICD-10-CM | POA: Diagnosis not present

## 2021-09-01 LAB — POCT RAPID STREP A (OFFICE): Rapid Strep A Screen: POSITIVE — AB

## 2021-09-01 LAB — POCT INFLUENZA A/B
Influenza A, POC: NEGATIVE
Influenza B, POC: NEGATIVE

## 2021-09-01 MED ORDER — CLINDAMYCIN HCL 300 MG PO CAPS: 300.0000 mg | ORAL_CAPSULE | Freq: Three times a day (TID) | ORAL | 0 refills | Status: AC

## 2021-09-01 NOTE — Progress Notes (Signed)
Virtual Visit via Telephone Note  I connected withNAME@ on 09/01/21 at  2:00 PM EST by telephone and verified that I am speaking with the correct person using two identifiers.  Location: Patient: home Provider: office   I discussed the limitations, risks, security and privacy concerns of performing an evaluation and management service by telephone and the availability of in person appointments. I also discussed with the patient that there may be a patient responsible charge related to this service. The patient expressed understanding and agreed to proceed.   History of Present Illness: Patient stated that she woke up this morning with sore throat and non productive cough, nasal congestion, sinus pressure, chills, wheezing, little HA yesterday. Pt denied fever , trouble swallowing, SOB, CP,palpitations. She has used mucinex which helped some. She has not been around anyone that was sick.    Observations/Objective:   Assessment and Plan: POCT Rapid Strep and POCT Influenza A& B Positive for Strep A. Clindamycin 300 mg TID for 10 days  Use albuterol inhaler as needed for wheezing.  Follow Up Instructions: Take all antibiotics as ordered. Follow up in one week if symptoms does not get better or gets worse.   I discussed the assessment and treatment plan with the patient. The patient was provided an opportunity to ask questions and all were answered. The patient agreed with the plan and demonstrated an understanding of the instructions.   The patient was advised to call back or seek an in-person evaluation if the symptoms worsen or if the condition fails to improve as anticipated.

## 2021-09-02 ENCOUNTER — Encounter: Payer: Self-pay | Admitting: Nurse Practitioner

## 2021-09-02 DIAGNOSIS — B950 Streptococcus, group A, as the cause of diseases classified elsewhere: Secondary | ICD-10-CM | POA: Insufficient documentation

## 2021-09-02 NOTE — Assessment & Plan Note (Signed)
Clindamycin 300mg  TID for 10 days.

## 2021-09-18 ENCOUNTER — Other Ambulatory Visit: Payer: Self-pay | Admitting: General Surgery

## 2021-09-18 DIAGNOSIS — Z1239 Encounter for other screening for malignant neoplasm of breast: Secondary | ICD-10-CM

## 2021-10-05 LAB — CBC WITH DIFFERENTIAL/PLATELET
Basophils Absolute: 0.1 10*3/uL (ref 0.0–0.2)
Basos: 1 %
EOS (ABSOLUTE): 0.1 10*3/uL (ref 0.0–0.4)
Eos: 2 %
Hematocrit: 44 % (ref 34.0–46.6)
Hemoglobin: 14.2 g/dL (ref 11.1–15.9)
Immature Grans (Abs): 0 10*3/uL (ref 0.0–0.1)
Immature Granulocytes: 0 %
Lymphocytes Absolute: 2 10*3/uL (ref 0.7–3.1)
Lymphs: 28 %
MCH: 26.8 pg (ref 26.6–33.0)
MCHC: 32.3 g/dL (ref 31.5–35.7)
MCV: 83 fL (ref 79–97)
Monocytes Absolute: 0.4 10*3/uL (ref 0.1–0.9)
Monocytes: 6 %
Neutrophils Absolute: 4.5 10*3/uL (ref 1.4–7.0)
Neutrophils: 63 %
Platelets: 266 10*3/uL (ref 150–450)
RBC: 5.3 x10E6/uL — ABNORMAL HIGH (ref 3.77–5.28)
RDW: 13.4 % (ref 11.7–15.4)
WBC: 7.1 10*3/uL (ref 3.4–10.8)

## 2021-10-05 LAB — LIPID PANEL WITH LDL/HDL RATIO
Cholesterol, Total: 139 mg/dL (ref 100–199)
HDL: 45 mg/dL (ref >39)
LDL Chol Calc (NIH): 70 mg/dL (ref 0–99)
LDL/HDL Ratio: 1.6 ratio (ref 0.0–3.2)
Triglycerides: 135 mg/dL (ref 0–149)
VLDL Cholesterol Cal: 24 mg/dL (ref 5–40)

## 2021-10-05 LAB — CMP14+EGFR
ALT: 20 IU/L (ref 0–32)
AST: 20 IU/L (ref 0–40)
Albumin/Globulin Ratio: 1.8 (ref 1.2–2.2)
Albumin: 4.7 g/dL (ref 3.8–4.9)
Alkaline Phosphatase: 92 IU/L (ref 44–121)
BUN/Creatinine Ratio: 23 (ref 9–23)
BUN: 15 mg/dL (ref 6–24)
Bilirubin Total: 0.7 mg/dL (ref 0.0–1.2)
CO2: 27 mmol/L (ref 20–29)
Calcium: 10 mg/dL (ref 8.7–10.2)
Chloride: 97 mmol/L (ref 96–106)
Creatinine, Ser: 0.66 mg/dL (ref 0.57–1.00)
Globulin, Total: 2.6 g/dL (ref 1.5–4.5)
Glucose: 180 mg/dL — ABNORMAL HIGH (ref 70–99)
Potassium: 4.2 mmol/L (ref 3.5–5.2)
Sodium: 138 mmol/L (ref 134–144)
Total Protein: 7.3 g/dL (ref 6.0–8.5)
eGFR: 102 mL/min/{1.73_m2} (ref >59)

## 2021-10-05 LAB — HEMOGLOBIN A1C
Est. average glucose Bld gHb Est-mCnc: 137 mg/dL
Hgb A1c MFr Bld: 6.4 % — ABNORMAL HIGH (ref 4.8–5.6)

## 2021-10-05 NOTE — Progress Notes (Signed)
Labs are stable. Glucose was high, but A1c looks good at 6.4%, so we are at goal.

## 2021-10-10 ENCOUNTER — Other Ambulatory Visit: Payer: Self-pay | Admitting: Nurse Practitioner

## 2021-10-10 ENCOUNTER — Encounter: Payer: Self-pay | Admitting: Nurse Practitioner

## 2021-10-10 ENCOUNTER — Ambulatory Visit: Payer: 59 | Admitting: Nurse Practitioner

## 2021-10-10 VITALS — BP 146/91 | HR 110 | Ht 66.0 in | Wt 224.0 lb

## 2021-10-10 DIAGNOSIS — E66812 Obesity, class 2: Secondary | ICD-10-CM

## 2021-10-10 DIAGNOSIS — R051 Acute cough: Secondary | ICD-10-CM | POA: Diagnosis not present

## 2021-10-10 DIAGNOSIS — D751 Secondary polycythemia: Secondary | ICD-10-CM

## 2021-10-10 DIAGNOSIS — H60502 Unspecified acute noninfective otitis externa, left ear: Secondary | ICD-10-CM | POA: Diagnosis not present

## 2021-10-10 DIAGNOSIS — E782 Mixed hyperlipidemia: Secondary | ICD-10-CM

## 2021-10-10 DIAGNOSIS — E119 Type 2 diabetes mellitus without complications: Secondary | ICD-10-CM

## 2021-10-10 DIAGNOSIS — R059 Cough, unspecified: Secondary | ICD-10-CM | POA: Insufficient documentation

## 2021-10-10 DIAGNOSIS — H6090 Unspecified otitis externa, unspecified ear: Secondary | ICD-10-CM | POA: Insufficient documentation

## 2021-10-10 DIAGNOSIS — J029 Acute pharyngitis, unspecified: Secondary | ICD-10-CM

## 2021-10-10 DIAGNOSIS — Z6838 Body mass index (BMI) 38.0-38.9, adult: Secondary | ICD-10-CM

## 2021-10-10 LAB — POCT INFLUENZA A/B
Influenza A, POC: NEGATIVE
Influenza B, POC: NEGATIVE

## 2021-10-10 LAB — POCT RAPID STREP A (OFFICE): Rapid Strep A Screen: NEGATIVE

## 2021-10-10 MED ORDER — CIPROFLOXACIN-DEXAMETHASONE 0.3-0.1 % OT SUSP: 4.0000 [drp] | Freq: Two times a day (BID) | OTIC | 0 refills | Status: AC

## 2021-10-10 MED ORDER — BENZONATATE 100 MG PO CAPS: 100.0000 mg | ORAL_CAPSULE | Freq: Two times a day (BID) | ORAL | 0 refills | Status: DC | PRN

## 2021-10-10 NOTE — Patient Instructions (Signed)
Please have fasting labs drawn 2-3 days prior to your appointment so we can discuss the results during your office visit.  I will be moving to [facility] located at [address] [place], [place] [address] effective Oct 15, 2021. If you would like to establish care with [facility] please call [phone].

## 2021-10-10 NOTE — Assessment & Plan Note (Signed)
stable °

## 2021-10-10 NOTE — Progress Notes (Signed)
Acute Office Visit  Subjective:    Patient ID: [name], female    DOB: 1966/02/08, 55 y.o.   MRN: [id number]  Chief Complaint  Patient presents with   Cough   Otitis Media   Sore Throat    Cough sore throat, ears stopped up    Cough Associated symptoms include ear pain.  Sore Throat  Associated symptoms include coughing and ear pain.  Patient is in today for sick visit and lab follow-up for T2DM, HTN, HLD, and polycythemia.  Symptoms started 10/07/21. She had sore throat and productive cough. She is having left ear pain. She states her husband had a similar illness 09/29/21. She has been taking mucinex.  Past Medical History:  Diagnosis Date   Allergy    Asthma    Asthma    Phreesia 03/27/2020   Breast calcification, left 02/25/2012   Controlled type 2 diabetes mellitus without complication ([place])    Depression 11/12/2019   Family history of breast cancer    Family history of colon cancer    Family history of kidney cancer    Family history of ovarian cancer    Family history of pancreatic cancer    Female stress incontinence 06/09/2020   GERD (gastroesophageal reflux disease)    Headache(784.0)    migraines occ   Hypertension    Menopausal symptom 06/09/2020   Mutation in [place] gene 07/05/2020   Obesity 03/30/2020   Wears glasses     Past Surgical History:  Procedure Laterality Date   ABDOMINAL HYSTERECTOMY  several yrs ago   partial   BREAST SURGERY  yrs ago   biopsy    DILATION AND CURETTAGE OF UTERUS     LAPAROSCOPIC BILATERAL SALPINGO OOPHERECTOMY Bilateral 12/23/2020   Procedure: LAPAROSCOPIC BILATERAL SALPINGO OOPHORECTOMY;  Surgeon: Cheri Fowler, MD;  Location: [facility];  Service: Gynecology;  Laterality: Bilateral;   NASAL SINUS SURGERY  2006   deviated septum    Family History  Problem Relation Age of Onset   Hypertension Mother    Breast cancer Mother 58   Polycythemia Father     Pancreatic cancer Father    Lung cancer Brother        smoker   Breast cancer Maternal Aunt        dx over 80   Ovarian cancer Maternal Aunt    Colon cancer Maternal Uncle        dx over 56   Cancer Paternal Aunt        Vulvar cancer   Stroke Maternal Grandmother    Prostate cancer Maternal Grandfather    Pancreatic cancer Paternal Grandmother    Polycythemia Brother    Kidney cancer Paternal Aunt        d. late 15s-early 50s    Social History   Socioeconomic History   Marital status: Married    Spouse name: Scientist, physiological    Number of children: 1   Years of education: Not on file   Highest education level: 12th grade  Occupational History   Not on file  Tobacco Use   Smoking status: Never   Smokeless tobacco: Never  Vaping Use   Vaping Use: Never used  Substance and Sexual Activity   Alcohol use: No   Drug use: No   Sexual activity: Yes    Comment: hysterectomy  Other Topics Concern   Not on file  Social History Narrative   Lives with husband [name] of 33 years.   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 As needed for asthma.     Ascorbic Acid (VITAMIN C WITH ROSE HIPS) 500 MG tablet Take 500 mg by mouth daily.      atorvastatin (LIPITOR) 10 MG tablet TAKE ONE TABLET ($RemoveBef'10MG'oSiuuhpNGb$  TOTAL) BY MOUTH DAILY 90 tablet 0   cetirizine (ZYRTEC) 10 MG tablet Take 10 mg by mouth daily.     esomeprazole (NEXIUM) 20 MG capsule Take 20 mg by mouth daily at 12 noon.     fluticasone (FLONASE) 50 MCG/ACT nasal spray Place 2 sprays into both nostrils daily. 16 g 6   loratadine (CLARITIN) 10 MG tablet Take 10 mg by mouth daily.     losartan-hydrochlorothiazide (HYZAAR) 50-12.5 MG tablet TAKE ONE (1) TABLET BY MOUTH EVERY DAY 90 tablet 3   metFORMIN (GLUCOPHAGE) 500 MG tablet TAKE TWO (2) TABLETS BY MOUTH 2 TIMES DAILY 360 tablet 0   MOUNJARO 5 MG/0.5ML Pen INJECT $RemoveBe'5MG'KUELYHrYs$  INTO THE SKIN ONCE A WEEK 6 mL 0   olopatadine (PATANOL) 0.1 % ophthalmic solution Place 1 drop into the left eye 2 (two) times daily. 5 mL 12   Omega-3 Fatty Acids (FISH OIL) 1200 MG CAPS Take 1 capsule by mouth daily.     OVER THE COUNTER MEDICATION Take 2 tablets by mouth daily. Womens Mutilvitamin     OVER THE COUNTER MEDICATION Querctin 500 mg dialy     SUMAtriptan (IMITREX) 100 MG tablet Take 1 tablet (100 mg total) by mouth every 2 (two) hours as needed. 10 tablet 2   UNABLE TO FIND Med Name: vit d 3 2000 iu daily     UNKNOWN TO PATIENT Maintenance powder inhaler     vitamin E 180 MG (400 UNITS) capsule Take 400 Units by mouth daily.     zinc gluconate 50 MG tablet Take 50 mg by mouth daily.     No facility-administered medications prior to visit.    Allergies  Allergen Reactions   Other Hives and Itching   Cefoxitin Hives and Itching    Review of Systems  Constitutional: Negative.   HENT:  Positive for ear pain, sinus pressure and sinus pain.   Respiratory:  Positive for cough.   Cardiovascular: Negative.   Psychiatric/Behavioral: Negative.        Objective:    Physical Exam Constitutional:      Appearance: She is well-developed.  HENT:     Right Ear: Tympanic membrane and ear canal normal.     Ears:     Comments: Exudate  in left auditory canal Cardiovascular:  Rate and Rhythm: Regular rhythm. Tachycardia present.     Heart sounds: Normal heart sounds.  Pulmonary:     Effort: Pulmonary effort is normal.     Breath sounds: Normal breath sounds.  Neurological:     Mental Status: She is alert.    BP (!) 146/91    Pulse (!) 110    Ht $R'5\' 6"'xk$  (1.676 m)    Wt 224 lb (101.6 kg)    SpO2 93%    BMI 36.15 kg/m  Wt Readings from Last 3 Encounters:  10/10/21 224 lb (101.6 kg)  08/21/21 235 lb 6.4 oz (106.8 kg)  07/27/21 241 lb 3.2 oz (109.4 kg)    Health Maintenance Due  Topic Date Due   COVID-19 Vaccine (1) Never done   OPHTHALMOLOGY EXAM  Never done   Hepatitis C Screening  Never done   TETANUS/TDAP  Never done   COLONOSCOPY (Pts 45-29yrs Insurance coverage will need to be confirmed)  Never done   Zoster Vaccines- Shingrix (1 of 2) Never done   FOOT EXAM  06/09/2021    There are no preventive care reminders to display for this patient.   Lab Results  Component Value Date   TSH 2.020 06/09/2021   Lab Results  Component Value Date   WBC 7.1 10/04/2021   HGB 14.2 10/04/2021   HCT 44.0 10/04/2021   MCV 83 10/04/2021   PLT 266 10/04/2021   Lab Results  Component Value Date   NA 138 10/04/2021   K 4.2 10/04/2021   CO2 27 10/04/2021   GLUCOSE 180 (H) 10/04/2021   BUN 15 10/04/2021   CREATININE 0.66 10/04/2021   BILITOT 0.7 10/04/2021   ALKPHOS 92 10/04/2021   AST 20 10/04/2021   ALT 20 10/04/2021   PROT 7.3 10/04/2021   ALBUMIN 4.7 10/04/2021   CALCIUM 10.0 10/04/2021   ANIONGAP 8 12/20/2020   EGFR 102 10/04/2021   Lab Results  Component Value Date   CHOL 139 10/04/2021   Lab Results  Component Value Date   HDL 45 10/04/2021   Lab Results  Component Value Date   LDLCALC 70 10/04/2021   Lab Results  Component Value Date   TRIG 135 10/04/2021   Lab Results  Component Value Date   CHOLHDL 2.9 09/02/2020   Lab Results  Component Value Date   HGBA1C 6.4 (H)  10/04/2021       Assessment & Plan:   Problem List Items Addressed This Visit       Endocrine   Diabetes mellitus without complication ([place]) - Primary    Lab Results  Component Value Date   HGBA1C 6.4 (H) 10/04/2021  -on atorvastatin, mounjaro, metformin, and losartan-HCTZ      Relevant Orders   CBC with Differential/Platelet   CMP14+EGFR   Lipid Panel With LDL/HDL Ratio   Hemoglobin A1c     Nervous and Auditory   Otitis externa    -Rx. ciprodex      Relevant Medications   ciprofloxacin-dexamethasone (CIPRODEX) OTIC suspension     Other   Hyperlipidemia    Lab Results  Component Value Date   CHOL 139 10/04/2021   HDL 45 10/04/2021   LDLCALC 70 10/04/2021   TRIG 135 10/04/2021   CHOLHDL 2.9 09/02/2020  -at goal with atorvastatin      Relevant Orders   Lipid Panel With LDL/HDL Ratio   Obesity    Wt Readings from Last 3 Encounters:  10/10/21 224 lb (101.6 kg)  08/21/21 235  lb 6.4 oz (106.8 kg)  07/27/21 241 lb 3.2 oz (109.4 kg)  -losing weight with mounjaro      Polycythemia    -stable      Relevant Orders   CBC with Differential/Platelet   Cough    -had strep about a month ago; husband had illness 1-2 weeks ago -check strep, covid, and flu tests       Relevant Medications   benzonatate (TESSALON) 100 MG capsule   Other Relevant Orders   Novel Coronavirus, NAA ([facility])   POCT Influenza A/B   Other Visit Diagnoses     Sore throat       Relevant Orders   POCT rapid strep A (Completed)   Novel Coronavirus, NAA ([facility])   POCT Influenza A/B   Novel Coronavirus, NAA ([facility])   POCT Influenza A/B        Meds ordered this encounter  Medications   ciprofloxacin-dexamethasone (CIPRODEX) OTIC suspension    Sig: Place 4 drops into the left ear 2 (two) times daily for 7 days.    Dispense:  7.5 mL    Refill:  0   benzonatate (TESSALON) 100 MG capsule    Sig: Take 1 capsule (100 mg total) by mouth 2 (two) times daily as needed for cough.     Dispense:  20 capsule    Refill:  0     Noreene Larsson, NP

## 2021-10-10 NOTE — Assessment & Plan Note (Signed)
-  Rx. ciprodex

## 2021-10-10 NOTE — Assessment & Plan Note (Signed)
Lab Results  Component Value Date   CHOL 139 10/04/2021   HDL 45 10/04/2021   LDLCALC 70 10/04/2021   TRIG 135 10/04/2021   CHOLHDL 2.9 09/02/2020   -at goal with atorvastatin

## 2021-10-10 NOTE — Assessment & Plan Note (Signed)
Lab Results  Component Value Date   HGBA1C 6.4 (H) 10/04/2021   -on atorvastatin, mounjaro, metformin, and losartan-HCTZ

## 2021-10-10 NOTE — Assessment & Plan Note (Signed)
-  had strep about a month ago; husband had illness 1-2 weeks ago -check strep, covid, and flu tests

## 2021-10-10 NOTE — Assessment & Plan Note (Signed)
Wt Readings from Last 3 Encounters:  10/10/21 224 lb (101.6 kg)  08/21/21 235 lb 6.4 oz (106.8 kg)  07/27/21 241 lb 3.2 oz (109.4 kg)   -losing weight with mounjaro

## 2021-10-12 ENCOUNTER — Telehealth: Payer: Self-pay

## 2021-10-12 LAB — NOVEL CORONAVIRUS, NAA: SARS-CoV-2, NAA: NOT DETECTED

## 2021-10-12 LAB — SARS-COV-2, NAA 2 DAY TAT

## 2021-10-12 NOTE — Telephone Encounter (Signed)
If she calls a few pharmacies, another one may have it in stock.

## 2021-10-12 NOTE — Telephone Encounter (Signed)
LMTRC

## 2021-10-12 NOTE — Telephone Encounter (Signed)
Pt called in regard to [facility] 5 MG/0.5ML Pen    Pharm  will not have med in stock until mid Jan and pt is due for shot tomorrow   Pt is having a hard time finding medication anywhere else   Wanted to discuss other options if pt cant get medication

## 2021-10-12 NOTE — Progress Notes (Signed)
COVID and flu are negative.

## 2021-10-24 ENCOUNTER — Telehealth: Payer: Self-pay | Admitting: Family Medicine

## 2021-10-24 ENCOUNTER — Ambulatory Visit: Payer: Self-pay | Admitting: Family Medicine

## 2021-10-24 ENCOUNTER — Other Ambulatory Visit: Payer: Self-pay

## 2021-10-24 DIAGNOSIS — E119 Type 2 diabetes mellitus without complications: Secondary | ICD-10-CM

## 2021-10-24 DIAGNOSIS — E782 Mixed hyperlipidemia: Secondary | ICD-10-CM

## 2021-10-24 DIAGNOSIS — I10 Essential (primary) hypertension: Secondary | ICD-10-CM

## 2021-10-24 MED ORDER — TIRZEPATIDE 7.5 MG/0.5ML ~~LOC~~ SOAJ: 7.5000 mg | SUBCUTANEOUS | 0 refills | Status: DC

## 2021-10-24 NOTE — Telephone Encounter (Signed)
[facility] states the patient told them you were decreasing her Metformin and they need a new script to have on file with correct directions

## 2021-10-24 NOTE — Telephone Encounter (Signed)
[facility] called and said pt stopped by and said Dr Lacinda Axon was reducing her dosage. Pt does not need a refill at this time but the pharmacy would like a new Rx for when it needs to be refilled.  [phone]

## 2021-10-24 NOTE — Patient Instructions (Signed)
I have increased your Mounjaro.  Follow up in 6 months.  Call for refill on Mounjaro (if you want to continue current dosage or increase).  Take care  Dr. Lacinda Axon

## 2021-10-25 NOTE — Telephone Encounter (Signed)
Pharmacist at [facility] notified.

## 2021-10-25 NOTE — Assessment & Plan Note (Signed)
Well-controlled.  Continue Lipitor. 

## 2021-10-25 NOTE — Assessment & Plan Note (Signed)
Has been well controlled.  Advised to monitor her home blood pressure readings closely.  We will make adjustments to her medication regimen if needed.

## 2021-10-25 NOTE — Assessment & Plan Note (Signed)
At goal.  Patient would like to increase Mounjaro dosing.  Sent new dosing in today.  Patient inquired about decreasing and potentially stopping metformin.  Advised her to keep an eye on her blood sugars.  May decrease metformin to 500 mg twice daily while Mounjaro dose is increased.  If blood sugars remain well controlled can discontinue metformin altogether.

## 2021-10-25 NOTE — Progress Notes (Signed)
Subjective:  Patient ID: [name], female    DOB: 08/10/1966  Age: 56 y.o. MRN: [id number]  CC: Chief Complaint  Patient presents with   Establish Care    Left ear - still feels like fluid in the ear - completed ear drop abx in dec 22    HPI:  56 year old female with type 2 diabetes, hypertension, migraine, hyperlipidemia presents to establish care with me.  This is a new patient visit.  Hypertension Patient states that her BPs are well controlled at home.  BP elevated here today. Advised to check BP at home.  Currently on Losartan/HCTZ. Endorses compliance.   DM-2 Well controlled. Most recent A1C 6.4. Patient currently on metformin as well as Mounjaro.  She has lost weight on Mounjaro and would like to discuss increase in therapy.  Hyperlipidemia Has been well controlled on Lipitor with LDL of 70. Endorses compliance.  No reported side effects.  Patient Active Problem List   Diagnosis Date Noted   Polycythemia 07/10/2021   Genetic testing 07/05/2020   Obesity 03/30/2020   Diabetes mellitus without complication ([place]) 66/44/0347   Essential hypertension 11/12/2019   Hyperlipidemia 11/12/2019   Migraine 11/12/2019    Social Hx   Social History   Socioeconomic History   Marital status: Married    Spouse name: Scientist, physiological    Number of children: 1   Years of education: Not on file   Highest education level: 12th grade  Occupational History   Not on file  Tobacco Use   Smoking status: Never   Smokeless tobacco: Never  Vaping Use   Vaping Use: Never used  Substance and Sexual Activity   Alcohol use: No   Drug use: No   Sexual activity: Yes    Comment: hysterectomy  Other Topics Concern   Not on file  Social History Narrative   Lives with husband [name] of 30 years.   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   HENT:         Reports that her left ear feels like it has fluid in it.   Objective:  BP (!) 164/116    Pulse (!) 107    Temp 97.7 F (36.5 C)    Ht 5\' 6"  (1.676 m)    Wt 227 lb (103 kg)    SpO2 97%    BMI 36.64 kg/m   BP/Weight 10/24/2021 10/10/2021 42/02/9562  Systolic BP 875 643 329  Diastolic BP 518 91 86  Wt. (Lbs) 227 224 235.4  BMI 36.64 36.15 37.99    Physical Exam Constitutional:      General: She is not in acute distress.    Appearance: Normal appearance. She is obese. She is not ill-appearing.  HENT:  Head: Normocephalic and atraumatic.     Right Ear: Tympanic membrane normal.     Left Ear: Tympanic membrane normal.  Cardiovascular:     Rate and Rhythm: Normal rate and regular rhythm.  Pulmonary:     Effort: Pulmonary effort is normal.     Breath sounds: Normal breath sounds. No wheezing, rhonchi or rales.  Neurological:     Mental Status: She is alert.  Psychiatric:        Mood and Affect: Mood normal.        Behavior: Behavior normal.    Lab Results  Component Value Date   WBC 7.1 10/04/2021   HGB 14.2 10/04/2021   HCT 44.0 10/04/2021   PLT 266 10/04/2021   GLUCOSE 180 (H) 10/04/2021   CHOL 139 10/04/2021   TRIG 135 10/04/2021   HDL 45 10/04/2021   LDLCALC 70 10/04/2021   ALT 20 10/04/2021   AST 20 10/04/2021   NA 138 10/04/2021   K 4.2 10/04/2021   CL 97 10/04/2021   CREATININE 0.66 10/04/2021   BUN 15 10/04/2021   CO2 27 10/04/2021   TSH 2.020 06/09/2021   HGBA1C 6.4 (H) 10/04/2021     Assessment & Plan:   Problem List Items Addressed  This Visit       Cardiovascular and Mediastinum   Essential hypertension    Has been well controlled.  Advised to monitor her home blood pressure readings closely.  We will make adjustments to her medication regimen if needed.        Endocrine   Diabetes mellitus without complication ([place])    At goal.  Patient would like to increase Mounjaro dosing.  Sent new dosing in today.  Patient inquired about decreasing and potentially stopping metformin.  Advised her to keep an eye on her blood sugars.  May decrease metformin to 500 mg twice daily while Mounjaro dose is increased.  If blood sugars remain well controlled can discontinue metformin altogether.      Relevant Medications   tirzepatide (MOUNJARO) 7.5 MG/0.5ML Pen     Other   Hyperlipidemia    Well-controlled.  Continue Lipitor.       Meds ordered this encounter  Medications   tirzepatide (MOUNJARO) 7.5 MG/0.5ML Pen    Sig: Inject 7.5 mg into the skin once a week.    Dispense:  6 mL    Refill:  0    Follow-up:  6 months.  [place]

## 2021-10-25 NOTE — Telephone Encounter (Signed)
Emily Salt G, DO   If she has enough medication, I don't need to send in a new Rx. I advised her to cut back to 500 mg BID and if sugars remain well controlled can discontinue following increase in Mounjaro.

## 2021-10-26 ENCOUNTER — Other Ambulatory Visit: Payer: Self-pay | Admitting: Family Medicine

## 2021-10-26 DIAGNOSIS — E119 Type 2 diabetes mellitus without complications: Secondary | ICD-10-CM

## 2021-10-26 DIAGNOSIS — E782 Mixed hyperlipidemia: Secondary | ICD-10-CM

## 2021-11-06 ENCOUNTER — Ambulatory Visit: Payer: 59 | Admitting: Nurse Practitioner

## 2021-11-06 ENCOUNTER — Other Ambulatory Visit: Payer: Self-pay

## 2021-11-06 ENCOUNTER — Encounter: Payer: Self-pay | Admitting: Nurse Practitioner

## 2021-11-06 VITALS — BP 142/96 | HR 96 | Temp 97.5°F | Ht 66.0 in | Wt 226.0 lb

## 2021-11-06 DIAGNOSIS — J309 Allergic rhinitis, unspecified: Secondary | ICD-10-CM | POA: Diagnosis not present

## 2021-11-06 DIAGNOSIS — H938X2 Other specified disorders of left ear: Secondary | ICD-10-CM

## 2021-11-06 MED ORDER — CETIRIZINE HCL 10 MG PO TABS: 10.0000 mg | ORAL_TABLET | Freq: Every day | ORAL | 2 refills | Status: DC

## 2021-11-06 NOTE — Progress Notes (Addendum)
Subjective:    Patient ID: [name], female    DOB: [birth year], 56 y.o.   MRN: [id number]  HPI Patient here for follow-up for left ear pressure and fullness.  She was treated for an ear infection to her left ear around Christmas.  Patient presents today for continued intermittent ear discomfort in the left ear with a dull ache at times.  Patient states that she can hear her voice echoing in her left ear.  Patient also states that her ear sometimes clears when she opens her mouth wide or yawns.  Patient denies fevers, chills, cough, dizziness, wheezing, and congestion.  However, patient admits to seasonal allergies.    Review of Systems  HENT:         +ear pressure  All other systems reviewed and are negative.     Objective:   Physical Exam Constitutional:      General: She is not in acute distress.    Appearance: Normal appearance. She is obese. She is not ill-appearing or toxic-appearing.  HENT:     Head: Normocephalic.     Right Ear: Hearing, tympanic membrane, ear canal and external ear normal. No decreased hearing noted. No laceration, drainage, swelling or tenderness. No middle ear effusion. There is impacted cerumen. No foreign body. No mastoid tenderness. No PE tube. No hemotympanum. Tympanic membrane is not injected, scarred, perforated, erythematous, retracted or bulging.     Left Ear: Hearing, tympanic membrane, ear canal and external ear normal. No decreased hearing noted. No laceration, drainage, swelling or tenderness.  No middle ear effusion. There is no impacted cerumen. No foreign body. No mastoid tenderness. No PE tube. No hemotympanum. Tympanic membrane is not injected, scarred, perforated, erythematous, retracted or bulging.     Nose: Congestion present. No rhinorrhea.     Right Turbinates: Swollen and pale. Not enlarged.     Left Turbinates: Swollen and pale. Not enlarged.     Right Sinus: No maxillary sinus tenderness or frontal sinus tenderness.     Left  Sinus: No maxillary sinus tenderness or frontal sinus tenderness.     Mouth/Throat:     Mouth: Mucous membranes are moist.     Pharynx: No oropharyngeal exudate or posterior oropharyngeal erythema.  Cardiovascular:     Rate and Rhythm: Normal rate and regular rhythm.     Pulses: Normal pulses.     Heart sounds: Normal heart sounds. No murmur heard. Pulmonary:     Effort: Pulmonary effort is normal. No respiratory distress.     Breath sounds: Normal breath sounds. No wheezing.  Musculoskeletal:        General: Normal range of motion.     Cervical back: Normal range of motion and neck supple.  Skin:    General: Skin is warm.  Neurological:     General: No focal deficit present.     Mental Status: She is alert and oriented to person, place, and time.  Psychiatric:        Mood and Affect: Mood normal.        Behavior: Behavior normal.          Assessment & Plan:   1. Ear pressure, left - Suspected allergic rhinitis. - Low suspicion for ear infection due to lack of clinical findings and systemic symptoms. -Zyrtec 10 mg daily - Return to clinic in 3 to 4 weeks or sooner for continued symptoms. - We will consider ENT referral if ear pressure not resolved with daily use of antihistamine  2. Allergic rhinitis, unspecified seasonality, unspecified trigger -Zyrtec 10 mg daily. - Return to clinic in 3 to 4 weeks if symptoms not better.  None

## 2021-12-04 ENCOUNTER — Ambulatory Visit: Payer: 59 | Admitting: Nurse Practitioner

## 2021-12-19 LAB — HM DIABETES EYE EXAM

## 2022-01-23 ENCOUNTER — Ambulatory Visit: Payer: 59 | Admitting: Family Medicine

## 2022-01-23 ENCOUNTER — Other Ambulatory Visit: Payer: Self-pay | Admitting: Family Medicine

## 2022-01-23 ENCOUNTER — Other Ambulatory Visit: Payer: Self-pay | Admitting: Nurse Practitioner

## 2022-01-23 VITALS — BP 164/100 | HR 102 | Temp 97.8°F | Ht 66.0 in | Wt 223.6 lb

## 2022-01-23 DIAGNOSIS — E782 Mixed hyperlipidemia: Secondary | ICD-10-CM

## 2022-01-23 DIAGNOSIS — I10 Essential (primary) hypertension: Secondary | ICD-10-CM | POA: Diagnosis not present

## 2022-01-23 DIAGNOSIS — E119 Type 2 diabetes mellitus without complications: Secondary | ICD-10-CM | POA: Diagnosis not present

## 2022-01-23 MED ORDER — LOSARTAN POTASSIUM-HCTZ 100-25 MG PO TABS: 1.0000 | ORAL_TABLET | Freq: Every day | ORAL | 3 refills | Status: DC

## 2022-01-23 NOTE — Patient Instructions (Addendum)
I have increased your BP medication (I sent in new Rx). ? ?Labs in ~ 10 days due to the increase in your medication. ? ?Follow up in 3-6 months (keep eye on BP). ? ?Take care ? ?Dr. Lacinda Axon  ?

## 2022-01-23 NOTE — Assessment & Plan Note (Signed)
Well-controlled.  Continue Lipitor. 

## 2022-01-23 NOTE — Assessment & Plan Note (Signed)
At goal.  Continue metformin and Mounjaro. ?

## 2022-01-23 NOTE — Assessment & Plan Note (Signed)
Uncontrolled.  Increasing losartan/HCTZ.  New Rx sent today.  Labs in 7 to 10 days. ?

## 2022-01-23 NOTE — Progress Notes (Signed)
? ?Subjective:  ?Patient ID: [name], female    DOB: [birth year]  Age: 56 y.o. MRN: [id number] ? ?CC: ?Chief Complaint  ?Patient presents with  ? Hypertension  ? Diabetes  ? ? ?HPI: ? ?56 year old female presents for follow-up regarding the above. ? ?Diabetes is at goal.  Last A1c was 6.4.  Blood sugars at home are well controlled.  She is doing well on metformin and Mounjaro. ? ?Patient's blood pressure is uncontrolled.  Was elevated at last visit as well.  She is currently on losartan and HCTZ.  Will discuss increasing therapy today. ? ?Lipids well controlled on Lipitor. ? ?Patient Active Problem List  ? Diagnosis Date Noted  ? Polycythemia 07/10/2021  ? Genetic testing 07/05/2020  ? Obesity 03/30/2020  ? Diabetes mellitus without complication ([place]) 55/37/4827  ? Essential hypertension 11/12/2019  ? Hyperlipidemia 11/12/2019  ? Migraine 11/12/2019  ? ? ?Social Hx   ?Social History  ? ?Socioeconomic History  ? Marital status: Married  ?  Spouse name: [name]   ? Number of children: 1  ? Years of education: Not on file  ? Highest education level: 12th grade  ?Occupational History  ? Not on file  ?Tobacco Use  ? Smoking status: Never  ? Smokeless tobacco: Never  ?Vaping Use  ? Vaping Use: Never used  ?Substance and Sexual Activity  ? Alcohol use: No  ? Drug use: No  ? Sexual activity: Yes  ?  Comment: hysterectomy  ?Other Topics Concern  ? Not on file  ?Social History Narrative  ? Lives with husband [name] of 70 years.  ? 1 child: son 74 years old, lives close by  ?   ? Dog: Charlie  ? Cats: Chat Cat; Callie  ?   ? Enjoys: reading, tv, music  ?   ? Diet: eats all food groups  ? Caffeine: unsweet tea  ? Water: 3 cups daily  ?   ? Wears seat belt  ? Does not use phone while driving  ? Smokes detectors at home  ? Weapons in lock box    ? ?Social Determinants of Health  ? ?Financial Resource Strain: Not on file  ?Food Insecurity: Not on file  ?Transportation Needs: Not on file  ?Physical Activity: Not on file  ?Stress:  Not on file  ?Social Connections: Not on file  ? ? ?Review of Systems  ?Constitutional: Negative.   ?Respiratory: Negative.    ?Cardiovascular: Negative.   ? ?Objective:  ?BP (!) 164/100   Pulse (!) 102   Temp 97.8 ?F (36.6 ?C) (Oral)   Ht '5\' 6"'$  (1.676 m)   Wt 223 lb 9.6 oz (101.4 kg)   SpO2 96%   BMI 36.09 kg/m?  ? ? ?  01/23/2022  ?  1:10 PM 11/06/2021  ?  8:28 AM 10/24/2021  ?  2:29 PM  ?BP/Weight  ?Systolic BP 078 675 449  ?Diastolic BP 201 96 007  ?Wt. (Lbs) 223.6 226 227  ?BMI 36.09 kg/m2 36.48 kg/m2 36.64 kg/m2  ? ? ?Physical Exam ?Vitals and nursing note reviewed.  ?Constitutional:   ?   General: She is not in acute distress. ?   Appearance: Normal appearance. She is not ill-appearing.  ?HENT:  ?   Head: Normocephalic and atraumatic.  ?Cardiovascular:  ?   Rate and Rhythm: Regular rhythm. Tachycardia present.  ?Pulmonary:  ?   Effort: Pulmonary effort is normal.  ?   Breath sounds: Normal breath sounds. No wheezing, rhonchi or rales.  ?  Neurological:  ?   Mental Status: She is alert.  ?Psychiatric:     ?   Mood and Affect: Mood normal.     ?   Behavior: Behavior normal.  ? ? ?Lab Results  ?Component Value Date  ? WBC 7.1 10/04/2021  ? HGB 14.2 10/04/2021  ? HCT 44.0 10/04/2021  ? PLT 266 10/04/2021  ? GLUCOSE 180 (H) 10/04/2021  ? CHOL 139 10/04/2021  ? TRIG 135 10/04/2021  ? HDL 45 10/04/2021  ? [place] 70 10/04/2021  ? ALT 20 10/04/2021  ? AST 20 10/04/2021  ? NA 138 10/04/2021  ? K 4.2 10/04/2021  ? CL 97 10/04/2021  ? CREATININE 0.66 10/04/2021  ? BUN 15 10/04/2021  ? CO2 27 10/04/2021  ? TSH 2.020 06/09/2021  ? HGBA1C 6.4 (H) 10/04/2021  ? ? ? ?Assessment & Plan:  ? ?Problem List Items Addressed This Visit   ? ?  ? Cardiovascular and Mediastinum  ? Essential hypertension  ?  Uncontrolled.  Increasing losartan/HCTZ.  New Rx sent today.  Labs in 7 to 10 days. ?  ?  ? Relevant Medications  ? losartan-hydrochlorothiazide (HYZAAR) 100-25 MG tablet  ? Other Relevant Orders  ? Basic metabolic panel  ?  ?  Endocrine  ? Diabetes mellitus without complication ([place]) - Primary  ?  At goal.  Continue metformin and Mounjaro. ?  ?  ? Relevant Medications  ? losartan-hydrochlorothiazide (HYZAAR) 100-25 MG tablet  ? Other Relevant Orders  ? Hemoglobin A1c  ?  ? Other  ? Hyperlipidemia  ?  Well-controlled.  Continue Lipitor. ?  ?  ? Relevant Medications  ? losartan-hydrochlorothiazide (HYZAAR) 100-25 MG tablet  ? ? ?Meds ordered this encounter  ?Medications  ? losartan-hydrochlorothiazide (HYZAAR) 100-25 MG tablet  ?  Sig: Take 1 tablet by mouth daily.  ?  Dispense:  90 tablet  ?  Refill:  3  ? ? ?Thersa Salt DO ?[place] ? ?

## 2022-01-24 ENCOUNTER — Telehealth: Payer: Self-pay | Admitting: *Deleted

## 2022-01-24 ENCOUNTER — Other Ambulatory Visit: Payer: Self-pay | Admitting: Family Medicine

## 2022-01-24 ENCOUNTER — Telehealth: Payer: Self-pay | Admitting: Family Medicine

## 2022-01-24 MED ORDER — TIRZEPATIDE 10 MG/0.5ML ~~LOC~~ SOAJ: 10.0000 mg | SUBCUTANEOUS | 0 refills | Status: DC

## 2022-01-24 NOTE — Telephone Encounter (Signed)
Pt contacted and verbalized understanding.  

## 2022-01-24 NOTE — Telephone Encounter (Signed)
Pt left voicemail on nurse line and is wanting to know if she can come off Metformin. Pt is currently on Mounjaro. Advise please. Thank you! ?

## 2022-01-24 NOTE — Telephone Encounter (Signed)
Patient would like to have the [facility] sent to [place] ?

## 2022-01-24 NOTE — Telephone Encounter (Signed)
Patient is currently on Mounjaro 7.5 mg but she says she is unable to find the 7.5 mg. She says she can find the 10 mg, 12 mg and 15 mg. She wants to know what you want her to do. ?

## 2022-01-25 ENCOUNTER — Other Ambulatory Visit: Payer: Self-pay | Admitting: *Deleted

## 2022-01-25 MED ORDER — TIRZEPATIDE 7.5 MG/0.5ML ~~LOC~~ SOAJ: 7.5000 mg | SUBCUTANEOUS | 0 refills | Status: DC

## 2022-01-25 NOTE — Telephone Encounter (Signed)
Patient called and stated [place] was able to get in the 7.5 mg Mounjaro and she was going to stick at that dose- she does not want to increase at this time- med list updated in Epic. ?

## 2022-02-08 LAB — BASIC METABOLIC PANEL
BUN/Creatinine Ratio: 22 (ref 9–23)
BUN: 16 mg/dL (ref 6–24)
CO2: 25 mmol/L (ref 20–29)
Calcium: 9.8 mg/dL (ref 8.7–10.2)
Chloride: 101 mmol/L (ref 96–106)
Creatinine, Ser: 0.74 mg/dL (ref 0.57–1.00)
Glucose: 146 mg/dL — ABNORMAL HIGH (ref 70–99)
Potassium: 4.1 mmol/L (ref 3.5–5.2)
Sodium: 140 mmol/L (ref 134–144)
eGFR: 94 mL/min/{1.73_m2} (ref >59)

## 2022-02-08 LAB — HEMOGLOBIN A1C
Est. average glucose Bld gHb Est-mCnc: 143 mg/dL
Hgb A1c MFr Bld: 6.6 % — ABNORMAL HIGH (ref 4.8–5.6)

## 2022-02-13 ENCOUNTER — Inpatient Hospital Stay (HOSPITAL_COMMUNITY): Payer: 59 | Attending: Hematology

## 2022-02-13 DIAGNOSIS — Z8051 Family history of malignant neoplasm of kidney: Secondary | ICD-10-CM | POA: Insufficient documentation

## 2022-02-13 DIAGNOSIS — Z803 Family history of malignant neoplasm of breast: Secondary | ICD-10-CM | POA: Diagnosis not present

## 2022-02-13 DIAGNOSIS — Z8041 Family history of malignant neoplasm of ovary: Secondary | ICD-10-CM | POA: Diagnosis not present

## 2022-02-13 DIAGNOSIS — Z79899 Other long term (current) drug therapy: Secondary | ICD-10-CM | POA: Diagnosis not present

## 2022-02-13 DIAGNOSIS — D751 Secondary polycythemia: Secondary | ICD-10-CM | POA: Diagnosis present

## 2022-02-13 DIAGNOSIS — Z800 Family history of malignant neoplasm of digestive organs: Secondary | ICD-10-CM | POA: Diagnosis not present

## 2022-02-13 LAB — CBC WITH DIFFERENTIAL/PLATELET
Abs Immature Granulocytes: 0.01 10*3/uL (ref 0.00–0.07)
Basophils Absolute: 0.1 10*3/uL (ref 0.0–0.1)
Basophils Relative: 1 %
Eosinophils Absolute: 0.2 10*3/uL (ref 0.0–0.5)
Eosinophils Relative: 3 %
HCT: 44.9 % (ref 36.0–46.0)
Hemoglobin: 14.1 g/dL (ref 12.0–15.0)
Immature Granulocytes: 0 %
Lymphocytes Relative: 36 %
Lymphs Abs: 2.3 10*3/uL (ref 0.7–4.0)
MCH: 26.2 pg (ref 26.0–34.0)
MCHC: 31.4 g/dL (ref 30.0–36.0)
MCV: 83.5 fL (ref 80.0–100.0)
Monocytes Absolute: 0.5 10*3/uL (ref 0.1–1.0)
Monocytes Relative: 8 %
Neutro Abs: 3.4 10*3/uL (ref 1.7–7.7)
Neutrophils Relative %: 52 %
Platelets: 236 10*3/uL (ref 150–400)
RBC: 5.38 MIL/uL — ABNORMAL HIGH (ref 3.87–5.11)
RDW: 15 % (ref 11.5–15.5)
WBC: 6.4 10*3/uL (ref 4.0–10.5)
nRBC: 0 % (ref 0.0–0.2)

## 2022-02-13 LAB — LACTATE DEHYDROGENASE: LDH: 119 U/L (ref 98–192)

## 2022-02-26 NOTE — Progress Notes (Signed)
? ?[facility] ?[address] ?[place], [place] [address] ? ? ?CLINIC:  ?Medical Oncology/Hematology ? ?PCP:  ?Coral Spikes, DO ?[place] [id number] ?[phone] ? ? ?REASON FOR VISIT:  ?Follow-up for elevated RBC count ? ?PRIOR THERAPY: None ? ?CURRENT THERAPY: Surveillance ? ?INTERVAL HISTORY:  ?[name] 56 y.o. 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  She was last seen by Dr. Delton Coombes on 08/21/2021. ? ?At today's visit, she reports feeling well.  No recent hospitalizations, surgeries, or changes in baseline health status.  She does not have any history of blood clots.  No symptoms of aquagenic pruritus, erythromelalgia, or vasomotor symptoms.  No B symptoms such as fever, chills, night sweats, unintentional weight loss. ? ?She does not smoke.  No concern for carbon monoxide exposure.  She does not have sleep apnea or symptoms of hypersomnolence.  She has been taking Hyzaar for approximately 5 to 10 years but is not on any other diuretic medication. ? ?She has 75% energy and 100% appetite. She endorses that she is maintaining a stable weight. ? ? ?REVIEW OF SYSTEMS:  ?Review of Systems  ?Constitutional:  Negative for appetite change, chills, diaphoresis, fatigue, fever and unexpected weight change.  ?HENT:   Negative for lump/mass and nosebleeds.   ?Eyes:  Negative for eye problems.  ?Respiratory:  Negative for cough, hemoptysis and shortness of breath.   ?Cardiovascular:  Negative for chest pain, leg swelling and palpitations.  ?Gastrointestinal:  Negative for abdominal pain, blood in stool, constipation, diarrhea, nausea and vomiting.  ?Genitourinary:  Negative for hematuria.   ?Skin: Negative.   ?Neurological:  Negative for dizziness, headaches and light-headedness.  ?Hematological:  Does not bruise/bleed easily.   ? ? ?PAST MEDICAL/SURGICAL HISTORY:  ?Past Medical History:  ?Diagnosis Date  ? Allergy   ? Asthma   ?  Asthma   ? Phreesia 03/27/2020  ? Breast calcification, left 02/25/2012  ? Controlled type 2 diabetes mellitus without complication ([place])   ? Depression 11/12/2019  ? Family history of breast cancer   ? Family history of colon cancer   ? Family history of kidney cancer   ? Family history of ovarian cancer   ? Family history of pancreatic cancer   ? Female stress incontinence 06/09/2020  ? GERD (gastroesophageal reflux disease)   ? Headache(784.0)   ? migraines occ  ? Hypertension   ? Menopausal symptom 06/09/2020  ? Mutation in [place] gene 07/05/2020  ? Obesity 03/30/2020  ? Wears glasses   ? ?Past Surgical History:  ?Procedure Laterality Date  ? ABDOMINAL HYSTERECTOMY  several yrs ago  ? partial  ? BREAST SURGERY  yrs ago  ? biopsy   ? DILATION AND CURETTAGE OF UTERUS    ? LAPAROSCOPIC BILATERAL SALPINGO OOPHERECTOMY Bilateral 12/23/2020  ? Procedure: LAPAROSCOPIC BILATERAL SALPINGO OOPHORECTOMY;  Surgeon: Cheri Fowler, MD;  Location: [facility];  Service: Gynecology;  Laterality: Bilateral;  ? NASAL SINUS SURGERY  2006  ? deviated septum  ? ? ? ?SOCIAL HISTORY:  ?Social History  ? ?Socioeconomic History  ? Marital status: Married  ?  Spouse name: [name]   ? Number of children: 1  ? Years of education: Not on file  ? Highest education level: 12th grade  ?Occupational History  ? Not on file  ?Tobacco Use  ? Smoking status: Never  ? Smokeless tobacco: Never  ?Vaping Use  ? Vaping Use: Never used  ?  Substance and Sexual Activity  ? Alcohol use: No  ? Drug use: No  ? Sexual activity: Yes  ?  Comment: hysterectomy  ?Other Topics Concern  ? Not on file  ?Social History Narrative  ? Lives with husband [name] of 56 years.  ? 1 child: son 63 years old, lives close by  ?   ? Dog: Charlie  ? Cats: Chat Cat; Callie  ?   ? Enjoys: reading, tv, music  ?   ? Diet: eats all food groups  ? Caffeine: unsweet tea  ? Water: 3 cups daily  ?   ? Wears seat belt  ? Does not use phone while driving  ? Smokes detectors at home  ?  Weapons in lock box    ? ?Social Determinants of Health  ? ?Financial Resource Strain: Not on file  ?Food Insecurity: Not on file  ?Transportation Needs: Not on file  ?Physical Activity: Not on file  ?Stress: Not on file  ?Social Connections: Not on file  ?Intimate Partner Violence: Not on file  ? ? ?FAMILY HISTORY:  ?Family History  ?Problem Relation Age of Onset  ? Hypertension Mother   ? Breast cancer Mother 59  ? Polycythemia Father   ? Pancreatic cancer Father   ? Lung cancer Brother   ?     smoker  ? Breast cancer Maternal Aunt   ?     dx over 33  ? Ovarian cancer Maternal Aunt   ? Colon cancer Maternal Uncle   ?     dx over 63  ? Cancer Paternal Aunt   ?     Vulvar cancer  ? Stroke Maternal Grandmother   ? Prostate cancer Maternal Grandfather   ? Pancreatic cancer Paternal Grandmother   ? Polycythemia Brother   ? Kidney cancer Paternal Aunt   ?     d. late 42s-early 40s  ? ? ?CURRENT MEDICATIONS:  ?Outpatient Encounter Medications as of 02/27/2022  ?Medication Sig Note  ? Ascorbic Acid (VITAMIN C WITH ROSE HIPS) 500 MG tablet Take 500 mg by mouth daily.   ? atorvastatin (LIPITOR) 10 MG tablet TAKE ONE TABLET ($RemoveBef'10MG'EWBKeJufKF$  TOTAL) BY MOUTH DAILY   ? cetirizine (ZYRTEC) 10 MG tablet TAKE ONE TABLET ($RemoveBef'10MG'aWHZYqFECD$  TOTAL) BY MOUTH DAILY   ? esomeprazole (NEXIUM) 20 MG capsule Take 20 mg by mouth daily at 12 noon.   ? fluticasone (FLONASE) 50 MCG/ACT nasal spray Place 2 sprays into both nostrils daily. 01/23/2022: PRN  ? losartan-hydrochlorothiazide (HYZAAR) 100-25 MG tablet Take 1 tablet by mouth daily.   ? olopatadine (PATANOL) 0.1 % ophthalmic solution Place 1 drop into the left eye 2 (two) times daily.   ? Omega-3 Fatty Acids (FISH OIL) 1200 MG CAPS Take 1 capsule by mouth daily.   ? OVER THE COUNTER MEDICATION Take 2 tablets by mouth daily. Womens Mutilvitamin   ? OVER THE COUNTER MEDICATION Querctin 500 mg dialy   ? SUMAtriptan (IMITREX) 100 MG tablet Take 1 tablet (100 mg total) by mouth every 2 (two) hours as needed.   ?  tirzepatide (MOUNJARO) 7.5 MG/0.5ML Pen Inject 7.5 mg into the skin once a week.   ? UNABLE TO FIND Med Name: vit d 3 2000 iu daily   ? UNKNOWN TO PATIENT Maintenance powder inhaler   ? VENTOLIN HFA 108 (90 Base) MCG/ACT inhaler INHALE TWO PUFFS INTO THE LUNGS FOUR TIMES DAILY AS NEEDED. SHAKE WELL BEFOREEACH USE.   ? vitamin E 180 MG (400 UNITS) capsule  Take 400 Units by mouth daily.   ? zinc gluconate 50 MG tablet Take 50 mg by mouth daily.   ? ?No facility-administered encounter medications on file as of 02/27/2022.  ? ? ?ALLERGIES:  ?Allergies  ?Allergen Reactions  ? Other Hives and Itching  ? Cefoxitin Hives and Itching  ? ? ? ?PHYSICAL EXAM:  ?ECOG PERFORMANCE STATUS: 0 - Asymptomatic ? ?There were no vitals filed for this visit. ?There were no vitals filed for this visit. ?Physical Exam ?Constitutional:   ?   Appearance: Normal appearance. She is obese.  ?HENT:  ?   Head: Normocephalic and atraumatic.  ?   Mouth/Throat:  ?   Mouth: Mucous membranes are moist.  ?Eyes:  ?   Extraocular Movements: Extraocular movements intact.  ?   Pupils: Pupils are equal, round, and reactive to light.  ?Cardiovascular:  ?   Rate and Rhythm: Normal rate and regular rhythm.  ?   Pulses: Normal pulses.  ?   Heart sounds: Normal heart sounds.  ?Pulmonary:  ?   Effort: Pulmonary effort is normal.  ?   Breath sounds: Normal breath sounds.  ?Abdominal:  ?   General: Bowel sounds are normal.  ?   Palpations: Abdomen is soft.  ?   Tenderness: There is no abdominal tenderness.  ?Musculoskeletal:     ?   General: No swelling.  ?   Right lower leg: No edema.  ?   Left lower leg: No edema.  ?Lymphadenopathy:  ?   Cervical: No cervical adenopathy.  ?Skin: ?   General: Skin is warm and dry.  ?Neurological:  ?   General: No focal deficit present.  ?   Mental Status: She is alert and oriented to person, place, and time.  ?Psychiatric:     ?   Mood and Affect: Mood normal.     ?   Behavior: Behavior normal.  ? ? ? ?LABORATORY DATA:  ?I have  reviewed the labs as listed.  ?CBC ?   ?Component Value Date/Time  ? WBC 6.4 02/13/2022 1505  ? RBC 5.38 (H) 02/13/2022 1505  ? HGB 14.1 02/13/2022 1505  ? HGB 14.2 10/04/2021 1139  ? HCT 44.9 02/13/2022 1505  ?

## 2022-02-27 ENCOUNTER — Inpatient Hospital Stay (HOSPITAL_COMMUNITY): Payer: 59 | Admitting: Physician Assistant

## 2022-02-27 VITALS — BP 124/95 | HR 96 | Temp 98.6°F | Resp 18 | Ht 66.0 in | Wt 221.6 lb

## 2022-02-27 DIAGNOSIS — D751 Secondary polycythemia: Secondary | ICD-10-CM

## 2022-02-27 NOTE — Patient Instructions (Signed)
[place] at [facility] ?Discharge Instructions ? ?You were seen today by Tarri Abernethy PA-C for your mildly elevated red blood cells.  As we discussed, your red blood cells are very minimally elevated, and are no worse than they were at your last visit.  Additionally, we have previously ruled out polycythemia vera by checking for various mutations that were negative. ? ?At this time, you do not need to continue to follow-up with hematology clinic. ? ?However, if you should notice any further increase in your hemoglobin and hematocrit, or increased white blood cells or platelets, you should be referred back to [place] for further work-up. ? ? ? ?Thank you for choosing [place] at [facility] to provide your oncology and hematology care.  To afford each patient quality time with our provider, please arrive at least 15 minutes before your scheduled appointment time.  ? ?If you have a lab appointment with the [facility] please come in thru the Main Entrance and check in at the main information desk. ? ?You need to re-schedule your appointment should you arrive 10 or more minutes late.  We strive to give you quality time with our providers, and arriving late affects you and other patients whose appointments are after yours.  Also, if you no show three or more times for appointments you may be dismissed from the clinic at the providers discretion.     ?Again, thank you for choosing [facility].  Our hope is that these requests will decrease the amount of time that you wait before being seen by our physicians.       ?_____________________________________________________________ ? ?Should you have questions after your visit to [facility], please contact our office at [phone] and follow the prompts.  Our office hours are 8:00 a.m. and 4:30 p.m. Monday - Friday.  Please note that voicemails left after 4:00 p.m. may not be returned until  the following business day.  We are closed weekends and major holidays.  You do have access to a nurse 24-7, just call the main number to the clinic [phone] and do not press any options, hold on the line and a nurse will answer the phone.   ? ?For prescription refill requests, have your pharmacy contact our office and allow 72 hours.   ? ?Due to Covid, you will need to wear a mask upon entering the hospital. If you do not have a mask, a mask will be given to you at the Main Entrance upon arrival. For doctor visits, patients may have 1 support person age 44 or older with them. For treatment visits, patients can not have anyone with them due to social distancing guidelines and our immunocompromised population.  ? ? ? ?

## 2022-03-22 ENCOUNTER — Other Ambulatory Visit: Payer: Self-pay | Admitting: Family Medicine

## 2022-03-22 DIAGNOSIS — E782 Mixed hyperlipidemia: Secondary | ICD-10-CM

## 2022-04-10 ENCOUNTER — Ambulatory Visit: Payer: 59 | Admitting: Nurse Practitioner

## 2022-04-18 ENCOUNTER — Other Ambulatory Visit: Payer: Self-pay | Admitting: Family Medicine

## 2022-04-19 ENCOUNTER — Other Ambulatory Visit: Payer: Self-pay | Admitting: Family Medicine

## 2022-05-25 ENCOUNTER — Encounter: Payer: Self-pay | Admitting: Family Medicine

## 2022-05-25 ENCOUNTER — Ambulatory Visit: Payer: 59 | Admitting: Family Medicine

## 2022-05-25 DIAGNOSIS — Z6835 Body mass index (BMI) 35.0-35.9, adult: Secondary | ICD-10-CM | POA: Diagnosis not present

## 2022-05-25 DIAGNOSIS — E119 Type 2 diabetes mellitus without complications: Secondary | ICD-10-CM | POA: Diagnosis not present

## 2022-05-25 DIAGNOSIS — I10 Essential (primary) hypertension: Secondary | ICD-10-CM | POA: Diagnosis not present

## 2022-05-25 MED ORDER — TIRZEPATIDE 10 MG/0.5ML ~~LOC~~ SOAJ
10.0000 mg | SUBCUTANEOUS | 0 refills | Status: DC
Start: 2022-05-25 — End: 2022-06-21

## 2022-05-25 NOTE — Patient Instructions (Signed)
Continue the medications. I increased the Mounjaro.  Follow up in 3 months.  Take care  Dr. Lacinda Axon

## 2022-05-28 NOTE — Assessment & Plan Note (Signed)
BP mildly elevated.  Continue current medication.  Hopeful that weight loss will improve blood pressure as well.

## 2022-05-28 NOTE — Assessment & Plan Note (Addendum)
A1c at goal.  Increasing Mounjaro to aid glycemic control as well as weight loss.

## 2022-05-28 NOTE — Progress Notes (Signed)
Subjective:  Patient ID: [name], female    DOB: [birth year]  Age: 57 y.o. MRN: [id number]  CC: Chief Complaint  Patient presents with   Diabetes    Sugars have been good. [id number] PM 140-150   Hypertension    Pt states she has been at 216/217 (at home) and is unable to lose any more weight. Pt would like to be under 200. Pt states that she wants to feel full.     HPI:  56 year old female with obesity, hyperlipidemia, type 2 diabetes, migraine, hypertension presents for follow-up.  Most recent A1c was 6.6.  She is doing well on Mounjaro.  She states that her weight loss has plateaued.  She wants additional weight loss.  Will discuss further titration of Mounjaro today.  BP mildly elevated here today.  She is compliant with losartan/HCTZ.  I anticipate that she will have further reduction in her blood pressure with weight loss.  She states that she is otherwise doing well.  No other complaints or concerns at this time.  Patient Active Problem List   Diagnosis Date Noted   Genetic testing 07/05/2020   Obesity 03/30/2020   Diabetes mellitus without complication ([place]) 97/35/3299   Essential hypertension 11/12/2019   Hyperlipidemia 11/12/2019   Migraine 11/12/2019    Social Hx   Social History   Socioeconomic History   Marital status: Married    Spouse name: Scientist, physiological    Number of children: 1   Years of education: Not on file   Highest education level: 12th grade  Occupational History   Not on file  Tobacco Use   Smoking status: Never   Smokeless tobacco: Never  Vaping Use   Vaping Use: Never used  Substance and Sexual Activity   Alcohol use: No   Drug use: No   Sexual activity: Yes    Comment: hysterectomy  Other Topics Concern   Not on file  Social History Narrative   Lives with husband [name] of 30 years.   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   Weapons in lock box     Social Determinants of Health   Financial Resource Strain: Low Risk  (03/30/2020)   Overall Financial Resource Strain (CARDIA)    Difficulty of Paying Living Expenses: Not hard at all  Food Insecurity: No Food Insecurity (03/30/2020)   Hunger Vital Sign    Worried About Running Out of Food in the Last Year: Never true    [place] in the Last Year: Never true  Transportation Needs: No Transportation Needs (03/30/2020)   PRAPARE - Hydrologist (Medical): No    Lack of Transportation (Non-Medical): No  Physical Activity: Not on file  Stress: Stress Concern Present (03/30/2020)   [place]    Feeling of Stress : To some extent  Social Connections: Socially Integrated (03/30/2020)   Social Connection and Isolation Panel [NHANES]    Frequency of Communication with Friends and Family: Twice a week    Frequency of Social Gatherings with Friends and  Family: Once a week    Attends Religious Services: More than 4 times per year    Active Member of Clubs or Organizations: No    Attends Archivist Meetings: 1 to 4 times per year    Marital Status: Married    Review of Systems  Respiratory: Negative.    Cardiovascular: Negative.    Objective:  BP (!) 144/92   Pulse 82   Temp 97.8 F (36.6 C)   Wt 222 lb 9.6 oz (101 kg)   SpO2 96%   BMI 35.93 kg/m      05/25/2022    2:08 PM 02/27/2022    2:30 PM 01/23/2022    1:10 PM  BP/Weight  Systolic BP 875 643 329  Diastolic BP 92 95 518  Wt. (Lbs) 222.6 221.56 223.6  BMI 35.93 kg/m2 35.76 kg/m2 36.09 kg/m2    Physical Exam Constitutional:      Appearance: Normal appearance. She is obese.  HENT:     Head: Normocephalic and atraumatic.  Cardiovascular:     Rate and  Rhythm: Normal rate and regular rhythm.  Pulmonary:     Effort: Pulmonary effort is normal.     Breath sounds: Normal breath sounds. No wheezing, rhonchi or rales.  Neurological:     Mental Status: She is alert.  Psychiatric:        Mood and Affect: Mood normal.        Behavior: Behavior normal.     Lab Results  Component Value Date   WBC 6.4 02/13/2022   HGB 14.1 02/13/2022   HCT 44.9 02/13/2022   PLT 236 02/13/2022   GLUCOSE 146 (H) 02/07/2022   CHOL 139 10/04/2021   TRIG 135 10/04/2021   HDL 45 10/04/2021   LDLCALC 70 10/04/2021   ALT 20 10/04/2021   AST 20 10/04/2021   NA 140 02/07/2022   K 4.1 02/07/2022   CL 101 02/07/2022   CREATININE 0.74 02/07/2022   BUN 16 02/07/2022   CO2 25 02/07/2022   TSH 2.020 06/09/2021   HGBA1C 6.6 (H) 02/07/2022     Assessment & Plan:   Problem List Items Addressed This Visit       Cardiovascular and Mediastinum   Essential hypertension    BP mildly elevated.  Continue current medication.  Hopeful that weight loss will improve blood pressure as well.        Endocrine   Diabetes mellitus without complication ([place])    A4Z at goal.  Increasing Mounjaro to aid glycemic control as well as weight loss.      Relevant Medications   tirzepatide (MOUNJARO) 10 MG/0.5ML Pen     Other   Obesity    Increasing Mounjaro.      Relevant Medications   tirzepatide (MOUNJARO) 10 MG/0.5ML Pen    Meds ordered this encounter  Medications   tirzepatide (MOUNJARO) 10 MG/0.5ML Pen    Sig: Inject 10 mg into the skin once a week.    Dispense:  3 mL    Refill:  0    Follow-up:  Return in about 3 months (around 08/25/2022).  [place]

## 2022-05-28 NOTE — Assessment & Plan Note (Signed)
Increasing Mounjaro.

## 2022-06-20 ENCOUNTER — Other Ambulatory Visit: Payer: Self-pay | Admitting: Family Medicine

## 2022-07-19 ENCOUNTER — Other Ambulatory Visit: Payer: Self-pay | Admitting: Family Medicine

## 2022-08-17 ENCOUNTER — Other Ambulatory Visit: Payer: Self-pay | Admitting: Family Medicine

## 2022-08-24 ENCOUNTER — Ambulatory Visit: Payer: 59 | Admitting: Family Medicine

## 2022-08-24 VITALS — BP 132/94 | HR 84 | Temp 97.3°F | Ht 66.0 in | Wt 224.0 lb

## 2022-08-24 DIAGNOSIS — J302 Other seasonal allergic rhinitis: Secondary | ICD-10-CM

## 2022-08-24 DIAGNOSIS — E119 Type 2 diabetes mellitus without complications: Secondary | ICD-10-CM

## 2022-08-24 DIAGNOSIS — E782 Mixed hyperlipidemia: Secondary | ICD-10-CM | POA: Diagnosis not present

## 2022-08-24 DIAGNOSIS — I10 Essential (primary) hypertension: Secondary | ICD-10-CM

## 2022-08-24 MED ORDER — FLUTICASONE PROPIONATE 50 MCG/ACT NA SUSP: 2.0000 | Freq: Every day | NASAL | 6 refills | Status: DC

## 2022-08-24 NOTE — Patient Instructions (Signed)
Labs when you like.  Follow up in 6 months.  Take care  Dr. Lacinda Axon

## 2022-08-26 DIAGNOSIS — J302 Other seasonal allergic rhinitis: Secondary | ICD-10-CM | POA: Insufficient documentation

## 2022-08-26 NOTE — Assessment & Plan Note (Signed)
Stable but mildly elevated.  Continue current medication.

## 2022-08-26 NOTE — Assessment & Plan Note (Signed)
Labs ordered. At [facility]. Continue Mounjaro.

## 2022-08-26 NOTE — Assessment & Plan Note (Signed)
Flonase. Advised use of Patanol as well.

## 2022-08-26 NOTE — Progress Notes (Signed)
 Subjective:  Patient ID: Rodneshia A Rabago, female    DOB: 02/02/1966  Age: 56 y.o. MRN: [id number]  CC: Chief Complaint  Patient presents with   Diabetes    Mounjaro 10 mg - related questions related to vision   Hypertension    HPI:  56-year-old female with hypertension, migraine, type 2 diabetes, hyperlipidemia, obesity presents for follow-up.  BP mildly elevated today.  She endorses compliance with losartan and HCTZ.  She states that her blood pressures are typically elevated here in the clinic.  Patient's type 2 diabetes has been well controlled on Mounjaro.  Patient is in need of A1c.  Also needs urine ACR and foot exam.  Lipids have been well controlled on atorvastatin.  Patient states that she is feeling well today.  She does state that she is having sinus issues and watery eyes.  Patient Active Problem List   Diagnosis Date Noted   Seasonal allergies 08/26/2022   Genetic testing 07/05/2020   Obesity 03/30/2020   Diabetes mellitus without complication (HCC) 11/12/2019   Essential hypertension 11/12/2019   Hyperlipidemia 11/12/2019   Migraine 11/12/2019    Social Hx   Social History   Socioeconomic History   Marital status: Married    Spouse name: Dean    Number of children: 1   Years of education: Not on file   Highest education level: 12th grade  Occupational History   Not on file  Tobacco Use   Smoking status: Never   Smokeless tobacco: Never  Vaping Use   Vaping Use: Never used  Substance and Sexual Activity   Alcohol use: No   Drug use: No   Sexual activity: Yes    Comment: hysterectomy  Other Topics Concern   Not on file  Social History Narrative   Lives with husband Dean of 30 years.   1 child: son 29 years old, lives close by      Dog: Charlie   Cats: Chat Cat; Callie      Enjoys: reading, tv, music      Diet: eats all food groups   Caffeine: unsweet tea   Water: 3 cups daily      Wears seat belt   Does not use phone while driving    Smokes detectors at home   Weapons in lock box     Social Determinants of Health   Financial Resource Strain: Low Risk  (03/30/2020)   Overall Financial Resource Strain (CARDIA)    Difficulty of Paying Living Expenses: Not hard at all  Food Insecurity: No Food Insecurity (03/30/2020)   Hunger Vital Sign    Worried About Running Out of Food in the Last Year: Never true    Ran Out of Food in the Last Year: Never true  Transportation Needs: No Transportation Needs (03/30/2020)   PRAPARE - Transportation    Lack of Transportation (Medical): No    Lack of Transportation (Non-Medical): No  Physical Activity: Not on file  Stress: Stress Concern Present (03/30/2020)   [employer] - Occupational Stress Questionnaire    Feeling of Stress : To some extent  Social Connections: Socially Integrated (03/30/2020)   Social Connection and Isolation Panel [NHANES]    Frequency of Communication with Friends and Family: Twice a week    Frequency of Social Gatherings with Friends and Family: Once a week    Attends Religious Services: More than 4 times per year    Active Member of Clubs or Organizations: No      Attends Club or Organization Meetings: 1 to 4 times per year    Marital Status: Married    Review of Systems Per HPI  Objective:  BP (!) 132/94   Pulse 84   Temp (!) 97.3 F (36.3 C)   Ht 5' 6" (1.676 m)   Wt 224 lb (101.6 kg)   BMI 36.15 kg/m      08/24/2022    2:45 PM 05/25/2022    2:08 PM 02/27/2022    2:30 PM  BP/Weight  Systolic BP 132 144 124  Diastolic BP 94 92 95  Wt. (Lbs) 224 222.6 221.56  BMI 36.15 kg/m2 35.93 kg/m2 35.76 kg/m2    Physical Exam Vitals and nursing note reviewed.  Constitutional:      Appearance: Normal appearance. She is obese.  HENT:     Head: Normocephalic and atraumatic.  Eyes:     General:        Right eye: No discharge.        Left eye: No discharge.     Conjunctiva/sclera: Conjunctivae normal.  Cardiovascular:      Rate and Rhythm: Normal rate and regular rhythm.  Pulmonary:     Effort: Pulmonary effort is normal.     Breath sounds: Normal breath sounds.  Neurological:     Mental Status: She is alert.  Psychiatric:        Mood and Affect: Mood normal.        Behavior: Behavior normal.    Lab Results  Component Value Date   WBC 6.4 02/13/2022   HGB 14.1 02/13/2022   HCT 44.9 02/13/2022   PLT 236 02/13/2022   GLUCOSE 146 (H) 02/07/2022   CHOL 139 10/04/2021   TRIG 135 10/04/2021   HDL 45 10/04/2021   LDLCALC 70 10/04/2021   ALT 20 10/04/2021   AST 20 10/04/2021   NA 140 02/07/2022   K 4.1 02/07/2022   CL 101 02/07/2022   CREATININE 0.74 02/07/2022   BUN 16 02/07/2022   CO2 25 02/07/2022   TSH 2.020 06/09/2021   HGBA1C 6.6 (H) 02/07/2022    Assessment & Plan:   Problem List Items Addressed This Visit       Cardiovascular and Mediastinum   Essential hypertension    Stable but mildly elevated.  Continue current medication.        Endocrine   Diabetes mellitus without complication (HCC) - Primary    Labs ordered. At [facility]. Continue Mounjaro.       Relevant Orders   Microalbumin / creatinine urine ratio   Hemoglobin A1c   CMP14+EGFR     Other   Hyperlipidemia    LDL at goal. Continue lipitor. Lipid panel ordered.       Relevant Orders   Lipid panel   Seasonal allergies    Flonase. Advised use of Patanol as well.       Relevant Medications   fluticasone (FLONASE) 50 MCG/ACT nasal spray    Meds ordered this encounter  Medications   fluticasone (FLONASE) 50 MCG/ACT nasal spray    Sig: Place 2 sprays into both nostrils daily.    Dispense:  16 g    Refill:  6    Follow-up:  6 months  Jayce Cook DO [facility] 

## 2022-08-26 NOTE — Assessment & Plan Note (Signed)
LDL at goal. Continue lipitor. Lipid panel ordered.

## 2022-09-18 ENCOUNTER — Other Ambulatory Visit: Payer: Self-pay | Admitting: Family Medicine

## 2022-09-18 DIAGNOSIS — E782 Mixed hyperlipidemia: Secondary | ICD-10-CM

## 2022-09-26 LAB — LIPID PANEL
Chol/HDL Ratio: 2.9 ratio (ref 0.0–4.4)
Cholesterol, Total: 147 mg/dL (ref 100–199)
HDL: 51 mg/dL (ref >39)
LDL Chol Calc (NIH): 77 mg/dL (ref 0–99)
Triglycerides: 106 mg/dL (ref 0–149)
VLDL Cholesterol Cal: 19 mg/dL (ref 5–40)

## 2022-09-26 LAB — CMP14+EGFR
ALT: 20 IU/L (ref 0–32)
AST: 21 IU/L (ref 0–40)
Albumin/Globulin Ratio: 1.9 (ref 1.2–2.2)
Albumin: 4.6 g/dL (ref 3.8–4.9)
Alkaline Phosphatase: 94 IU/L (ref 44–121)
BUN/Creatinine Ratio: 26 — ABNORMAL HIGH (ref 9–23)
BUN: 18 mg/dL (ref 6–24)
Bilirubin Total: 0.6 mg/dL (ref 0.0–1.2)
CO2: 26 mmol/L (ref 20–29)
Calcium: 9.9 mg/dL (ref 8.7–10.2)
Chloride: 102 mmol/L (ref 96–106)
Creatinine, Ser: 0.68 mg/dL (ref 0.57–1.00)
Globulin, Total: 2.4 g/dL (ref 1.5–4.5)
Glucose: 142 mg/dL — ABNORMAL HIGH (ref 70–99)
Potassium: 4.4 mmol/L (ref 3.5–5.2)
Sodium: 143 mmol/L (ref 134–144)
Total Protein: 7 g/dL (ref 6.0–8.5)
eGFR: 101 mL/min/{1.73_m2} (ref >59)

## 2022-09-26 LAB — MICROALBUMIN / CREATININE URINE RATIO
Creatinine, Urine: 190.1 mg/dL
Microalb/Creat Ratio: 20 mg/g creat (ref 0–29)
Microalbumin, Urine: 37.8 ug/mL

## 2022-09-26 LAB — HEMOGLOBIN A1C
Est. average glucose Bld gHb Est-mCnc: 143 mg/dL
Hgb A1c MFr Bld: 6.6 % — ABNORMAL HIGH (ref 4.8–5.6)

## 2022-10-18 ENCOUNTER — Other Ambulatory Visit: Payer: Self-pay | Admitting: Family Medicine

## 2022-11-19 LAB — HM DIABETES EYE EXAM

## 2022-11-27 ENCOUNTER — Encounter: Payer: Self-pay | Admitting: *Deleted

## 2022-12-19 ENCOUNTER — Other Ambulatory Visit: Payer: Self-pay | Admitting: *Deleted

## 2022-12-19 DIAGNOSIS — E782 Mixed hyperlipidemia: Secondary | ICD-10-CM

## 2022-12-19 MED ORDER — ATORVASTATIN CALCIUM 10 MG PO TABS: ORAL_TABLET | ORAL | 0 refills | Status: DC

## 2022-12-19 MED ORDER — LOSARTAN POTASSIUM-HCTZ 100-25 MG PO TABS: 1.0000 | ORAL_TABLET | Freq: Every day | ORAL | 0 refills | Status: DC

## 2022-12-31 ENCOUNTER — Other Ambulatory Visit: Payer: Self-pay | Admitting: Family Medicine

## 2023-01-01 ENCOUNTER — Other Ambulatory Visit (HOSPITAL_COMMUNITY): Payer: Self-pay

## 2023-01-02 ENCOUNTER — Other Ambulatory Visit: Payer: Self-pay

## 2023-01-02 ENCOUNTER — Other Ambulatory Visit (HOSPITAL_COMMUNITY): Payer: Self-pay

## 2023-01-02 MED ORDER — MOUNJARO 10 MG/0.5ML ~~LOC~~ SOAJ
10.0000 mg | SUBCUTANEOUS | 3 refills | Status: DC
  Filled 2023-01-02: qty 2, 28d supply, fill #0
  Filled 2023-01-25: qty 2, 28d supply, fill #1
  Filled 2023-02-22: qty 2, 28d supply, fill #2
  Filled 2023-03-22: qty 2, 28d supply, fill #3

## 2023-01-19 ENCOUNTER — Other Ambulatory Visit: Payer: Self-pay | Admitting: Family Medicine

## 2023-01-25 ENCOUNTER — Other Ambulatory Visit (HOSPITAL_COMMUNITY): Payer: Self-pay

## 2023-02-22 ENCOUNTER — Ambulatory Visit (INDEPENDENT_AMBULATORY_CARE_PROVIDER_SITE_OTHER): Payer: Managed Care, Other (non HMO) | Admitting: Family Medicine

## 2023-02-22 VITALS — BP 126/84 | Ht 65.0 in | Wt 220.0 lb

## 2023-02-22 DIAGNOSIS — Z7985 Long-term (current) use of injectable non-insulin antidiabetic drugs: Secondary | ICD-10-CM | POA: Diagnosis not present

## 2023-02-22 DIAGNOSIS — E119 Type 2 diabetes mellitus without complications: Secondary | ICD-10-CM

## 2023-02-22 DIAGNOSIS — E782 Mixed hyperlipidemia: Secondary | ICD-10-CM | POA: Diagnosis not present

## 2023-02-22 DIAGNOSIS — I10 Essential (primary) hypertension: Secondary | ICD-10-CM | POA: Diagnosis not present

## 2023-02-22 DIAGNOSIS — Z130 Encounter for screening for diseases of the blood and blood-forming organs and certain disorders involving the immune mechanism: Secondary | ICD-10-CM

## 2023-02-22 MED ORDER — VENTOLIN HFA 108 (90 BASE) MCG/ACT IN AERS: INHALATION_SPRAY | RESPIRATORY_TRACT | 3 refills | Status: AC

## 2023-02-22 NOTE — Assessment & Plan Note (Signed)
Labs today.  Awaiting A1c.  Has been well-controlled on Mounjaro.  Continue.

## 2023-02-22 NOTE — Progress Notes (Signed)
Subjective:  Patient ID: [name], female    DOB: 1965-11-04  Age: 57 y.o. MRN: [id number]  CC: Chief Complaint  Patient presents with   Diabetes    Follow up- Would be interested in coming off cholesterol med id cholesterol os better on blood work    HPI:  57 year old female with type 2 diabetes, obesity, hypertension, hyperlipidemia, migraine presents for follow-up.  Patient states that overall she is doing well.    Diabetes has been well-controlled on Mounjaro.  Lipids have been fairly well-controlled on Lipitor.  Patient expresses a desire to come off of statin therapy.  We will discuss this today.  Hypertension is well-controlled on losartan/HCTZ.  Patient's preventative health care is up-to-date excluding tetanus vaccine.  Patient Active Problem List   Diagnosis Date Noted   Seasonal allergies 08/26/2022   Genetic testing 07/05/2020   Obesity 03/30/2020   Diabetes mellitus without complication (HCC) 11/12/2019   Essential hypertension 11/12/2019   Hyperlipidemia 11/12/2019   Migraine 11/12/2019    Social Hx   Social History   Socioeconomic History   Marital status: Married    Spouse name: Public house manager    Number of children: 1   Years of education: Not on file   Highest education level: 12th grade  Occupational History   Not on file  Tobacco Use   Smoking status: Never   Smokeless tobacco: Never  Vaping Use   Vaping Use: Never used  Substance and Sexual Activity   Alcohol use: No   Drug use: No   Sexual activity: Yes    Comment: hysterectomy  Other Topics Concern   Not on file  Social History Narrative   Lives with husband August Saucer of 30 years.   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   Weapons in lock box      Social Determinants of Health   Financial Resource Strain: Low Risk  (02/21/2023)   Overall Financial Resource Strain (CARDIA)    Difficulty of Paying Living Expenses: Not very hard  Food Insecurity: No Food Insecurity (02/21/2023)   Hunger Vital Sign    Worried About Running Out of Food in the Last Year: Never true    Ran Out of Food in the Last Year: Never true  Transportation Needs: No Transportation Needs (02/21/2023)   PRAPARE - Administrator, Civil Service (Medical): No    Lack of Transportation (Non-Medical): No  Physical Activity: Insufficiently Active (02/21/2023)   Exercise Vital Sign    Days of Exercise per Week: 2 days    Minutes of Exercise per Session: 40 min  Stress: No Stress Concern Present (02/21/2023)   [employer] of Occupational Health - Occupational Stress Questionnaire    Feeling of Stress : Not at all  Social Connections: Socially Integrated (02/21/2023)   Social Connection and Isolation Panel [NHANES]    Frequency of Communication with Friends and Family: More than three times a week    Frequency of Social Gatherings with Friends and Family: Three times a week    Attends Religious Services: More than 4 times per year  Active Member of Clubs or Organizations: Yes    Attends Banker Meetings: More than 4 times per year    Marital Status: Married    Review of Systems  Constitutional: Negative.   Respiratory: Negative.    Cardiovascular: Negative.    Objective:  BP 126/84   Ht 5\' 5"  (1.651 m)   Wt 220 lb (99.8 kg)   BMI 36.61 kg/m      02/22/2023    2:11 PM 08/24/2022    2:45 PM 05/25/2022    2:08 PM  BP/Weight  Systolic BP 126 132 144  Diastolic BP 84 94 92  Wt. (Lbs) 220 224 222.6  BMI 36.61 kg/m2 36.15 kg/m2 35.93 kg/m2    Physical Exam Vitals and nursing note reviewed.  Constitutional:      Appearance: Normal appearance. She is obese.  HENT:     Head: Normocephalic and atraumatic.  Eyes:     General:         Right eye: No discharge.        Left eye: No discharge.     Conjunctiva/sclera: Conjunctivae normal.  Cardiovascular:     Rate and Rhythm: Normal rate and regular rhythm.  Pulmonary:     Effort: Pulmonary effort is normal.     Breath sounds: Normal breath sounds. No wheezing, rhonchi or rales.  Neurological:     Mental Status: She is alert.  Psychiatric:        Mood and Affect: Mood normal.        Behavior: Behavior normal.    Lab Results  Component Value Date   WBC 6.4 02/13/2022   HGB 14.1 02/13/2022   HCT 44.9 02/13/2022   PLT 236 02/13/2022   GLUCOSE 142 (H) 09/25/2022   CHOL 147 09/25/2022   TRIG 106 09/25/2022   HDL 51 09/25/2022   LDLCALC 77 09/25/2022   ALT 20 09/25/2022   AST 21 09/25/2022   NA 143 09/25/2022   K 4.4 09/25/2022   CL 102 09/25/2022   CREATININE 0.68 09/25/2022   BUN 18 09/25/2022   CO2 26 09/25/2022   TSH 2.020 06/09/2021   HGBA1C 6.6 (H) 09/25/2022     Assessment & Plan:   Problem List Items Addressed This Visit       Cardiovascular and Mediastinum   Essential hypertension - Primary    Stable on losartan/HCTZ.  Continue.        Endocrine   Diabetes mellitus without complication (HCC)    Labs today.  Awaiting A1c.  Has been well-controlled on Mounjaro.  Continue.      Relevant Orders   CMP14+EGFR   Hemoglobin A1c   Microalbumin / creatinine urine ratio     Other   Hyperlipidemia    Fair control.  Rechecking today.  We had a long discussion about recommendations advocating for statin therapy in her circumstance.  I advised her that I would continue this medication specially if she is tolerating and not having side effects.  Continue Lipitor.      Relevant Orders   Lipid panel   Other Visit Diagnoses     Screening for deficiency anemia       Relevant Orders   CBC       Meds ordered this encounter  Medications   VENTOLIN HFA 108 (90 Base) MCG/ACT inhaler    Sig: INHALE TWO PUFFS INTO THE LUNGS FOUR TIMES DAILY AS  NEEDED. SHAKE WELL BEFOREEACH USE.    Dispense:  18 g  Refill:  3    Follow-up:  Return in about 6 months (around 08/25/2023).  Everlene Other DO [facility]

## 2023-02-22 NOTE — Assessment & Plan Note (Signed)
Stable on losartan/HCTZ.  Continue.

## 2023-02-22 NOTE — Patient Instructions (Addendum)
Labs ordered.  We can consider trial off statin when your labs return.  Follow up in 6 months  Take care  Dr. Adriana Simas

## 2023-02-22 NOTE — Assessment & Plan Note (Signed)
Fair control.  Rechecking today.  We had a long discussion about recommendations advocating for statin therapy in her circumstance.  I advised her that I would continue this medication specially if she is tolerating and not having side effects.  Continue Lipitor.

## 2023-02-25 ENCOUNTER — Other Ambulatory Visit (HOSPITAL_COMMUNITY): Payer: Self-pay

## 2023-02-28 LAB — CMP14+EGFR
ALT: 23 IU/L (ref 0–32)
AST: 21 IU/L (ref 0–40)
Albumin/Globulin Ratio: 1.9 (ref 1.2–2.2)
Albumin: 4.6 g/dL (ref 3.8–4.9)
Alkaline Phosphatase: 93 IU/L (ref 44–121)
BUN/Creatinine Ratio: 28 — ABNORMAL HIGH (ref 9–23)
BUN: 23 mg/dL (ref 6–24)
Bilirubin Total: 0.6 mg/dL (ref 0.0–1.2)
CO2: 27 mmol/L (ref 20–29)
Calcium: 10.1 mg/dL (ref 8.7–10.2)
Chloride: 101 mmol/L (ref 96–106)
Creatinine, Ser: 0.83 mg/dL (ref 0.57–1.00)
Globulin, Total: 2.4 g/dL (ref 1.5–4.5)
Glucose: 139 mg/dL — ABNORMAL HIGH (ref 70–99)
Potassium: 4.4 mmol/L (ref 3.5–5.2)
Sodium: 142 mmol/L (ref 134–144)
Total Protein: 7 g/dL (ref 6.0–8.5)
eGFR: 81 mL/min/{1.73_m2} (ref >59)

## 2023-02-28 LAB — CBC
Hematocrit: 46 % (ref 34.0–46.6)
Hemoglobin: 15.2 g/dL (ref 11.1–15.9)
MCH: 28.7 pg (ref 26.6–33.0)
MCHC: 33 g/dL (ref 31.5–35.7)
MCV: 87 fL (ref 79–97)
Platelets: 230 10*3/uL (ref 150–450)
RBC: 5.3 x10E6/uL — ABNORMAL HIGH (ref 3.77–5.28)
RDW: 13.4 % (ref 11.7–15.4)
WBC: 5.8 10*3/uL (ref 3.4–10.8)

## 2023-02-28 LAB — LIPID PANEL
Chol/HDL Ratio: 2.7 ratio (ref 0.0–4.4)
Cholesterol, Total: 153 mg/dL (ref 100–199)
HDL: 56 mg/dL (ref >39)
LDL Chol Calc (NIH): 77 mg/dL (ref 0–99)
Triglycerides: 112 mg/dL (ref 0–149)
VLDL Cholesterol Cal: 20 mg/dL (ref 5–40)

## 2023-02-28 LAB — MICROALBUMIN / CREATININE URINE RATIO
Creatinine, Urine: 75.2 mg/dL
Microalb/Creat Ratio: 11 mg/g creat (ref 0–29)
Microalbumin, Urine: 8.5 ug/mL

## 2023-02-28 LAB — HEMOGLOBIN A1C
Est. average glucose Bld gHb Est-mCnc: 140 mg/dL
Hgb A1c MFr Bld: 6.5 % — ABNORMAL HIGH (ref 4.8–5.6)

## 2023-03-18 ENCOUNTER — Other Ambulatory Visit: Payer: Self-pay | Admitting: Family Medicine

## 2023-03-18 DIAGNOSIS — E782 Mixed hyperlipidemia: Secondary | ICD-10-CM

## 2023-03-25 ENCOUNTER — Other Ambulatory Visit (HOSPITAL_COMMUNITY): Payer: Self-pay

## 2023-03-25 ENCOUNTER — Telehealth: Payer: Self-pay | Admitting: Nurse Practitioner

## 2023-03-25 NOTE — Telephone Encounter (Signed)
Patient states [facility] is out of 10 mg of Mounjaro but has 20 mg . She is wanting a prescription sent it for that.

## 2023-03-26 NOTE — Telephone Encounter (Signed)
Patient states [facility] is out of 10 mg of Mounjaro but has 20 mg . She is wanting a prescription sent it for that. Please advise

## 2023-03-27 ENCOUNTER — Other Ambulatory Visit (HOSPITAL_COMMUNITY): Payer: Self-pay

## 2023-03-29 NOTE — Telephone Encounter (Signed)
Spoke with patient and she states the pharmacy was able to get the 10 mg wegovy for her .

## 2023-03-29 NOTE — Telephone Encounter (Signed)
Left message for a return call for details  

## 2023-04-10 LAB — HM MAMMOGRAPHY

## 2023-04-16 ENCOUNTER — Other Ambulatory Visit: Payer: Self-pay | Admitting: Family Medicine

## 2023-04-18 ENCOUNTER — Other Ambulatory Visit: Payer: Self-pay | Admitting: Family Medicine

## 2023-04-19 ENCOUNTER — Other Ambulatory Visit (HOSPITAL_COMMUNITY): Payer: Self-pay

## 2023-04-19 MED ORDER — MOUNJARO 10 MG/0.5ML ~~LOC~~ SOAJ
10.0000 mg | SUBCUTANEOUS | 0 refills | Status: DC
  Filled 2023-04-19: qty 2, 28d supply, fill #0

## 2023-04-22 ENCOUNTER — Other Ambulatory Visit (HOSPITAL_COMMUNITY): Payer: Self-pay

## 2023-05-16 ENCOUNTER — Other Ambulatory Visit (HOSPITAL_COMMUNITY): Payer: Self-pay

## 2023-05-16 ENCOUNTER — Other Ambulatory Visit: Payer: Self-pay | Admitting: Family Medicine

## 2023-05-16 MED ORDER — MOUNJARO 10 MG/0.5ML ~~LOC~~ SOAJ
10.0000 mg | SUBCUTANEOUS | 0 refills | Status: DC
  Filled 2023-05-16: qty 2, 28d supply, fill #0

## 2023-05-27 ENCOUNTER — Other Ambulatory Visit: Payer: Self-pay | Admitting: Family Medicine

## 2023-05-27 ENCOUNTER — Telehealth: Payer: Self-pay | Admitting: Family Medicine

## 2023-05-27 MED ORDER — TIRZEPATIDE 12.5 MG/0.5ML ~~LOC~~ SOAJ
12.5000 mg | SUBCUTANEOUS | 1 refills | Status: DC
  Filled 2023-05-27: qty 2, 28d supply, fill #0
  Filled 2023-06-25: qty 2, 28d supply, fill #1
  Filled 2023-07-24: qty 2, 28d supply, fill #2
  Filled 2023-08-17: qty 2, 28d supply, fill #3

## 2023-05-27 NOTE — Telephone Encounter (Signed)
Patient check with her insurance and stated they would approve the next step up on Mounjaro.Marland Kitchen

## 2023-05-28 ENCOUNTER — Other Ambulatory Visit (HOSPITAL_COMMUNITY): Payer: Self-pay

## 2023-05-28 ENCOUNTER — Other Ambulatory Visit: Payer: Self-pay

## 2023-05-28 NOTE — Telephone Encounter (Signed)
Patient notified via My Chart

## 2023-05-30 ENCOUNTER — Other Ambulatory Visit (HOSPITAL_COMMUNITY): Payer: Self-pay

## 2023-06-14 ENCOUNTER — Other Ambulatory Visit: Payer: Self-pay | Admitting: Family Medicine

## 2023-06-14 DIAGNOSIS — E782 Mixed hyperlipidemia: Secondary | ICD-10-CM

## 2023-06-26 ENCOUNTER — Other Ambulatory Visit (HOSPITAL_COMMUNITY): Payer: Self-pay

## 2023-07-19 ENCOUNTER — Other Ambulatory Visit: Payer: Self-pay | Admitting: Family Medicine

## 2023-07-24 ENCOUNTER — Other Ambulatory Visit (HOSPITAL_COMMUNITY): Payer: Self-pay

## 2023-08-26 ENCOUNTER — Ambulatory Visit: Payer: Managed Care, Other (non HMO) | Admitting: Family Medicine

## 2023-08-26 VITALS — BP 118/82 | HR 92 | Temp 98.2°F | Ht 65.0 in | Wt 219.8 lb

## 2023-08-26 DIAGNOSIS — Z1211 Encounter for screening for malignant neoplasm of colon: Secondary | ICD-10-CM

## 2023-08-26 DIAGNOSIS — Z7985 Long-term (current) use of injectable non-insulin antidiabetic drugs: Secondary | ICD-10-CM | POA: Diagnosis not present

## 2023-08-26 DIAGNOSIS — E1159 Type 2 diabetes mellitus with other circulatory complications: Secondary | ICD-10-CM | POA: Diagnosis not present

## 2023-08-26 DIAGNOSIS — E782 Mixed hyperlipidemia: Secondary | ICD-10-CM | POA: Diagnosis not present

## 2023-08-26 DIAGNOSIS — I10 Essential (primary) hypertension: Secondary | ICD-10-CM

## 2023-08-26 DIAGNOSIS — Z23 Encounter for immunization: Secondary | ICD-10-CM

## 2023-08-26 DIAGNOSIS — E119 Type 2 diabetes mellitus without complications: Secondary | ICD-10-CM

## 2023-08-26 MED ORDER — LOSARTAN POTASSIUM-HCTZ 100-25 MG PO TABS: 1.0000 | ORAL_TABLET | Freq: Every day | ORAL | 3 refills | Status: DC

## 2023-08-26 MED ORDER — ATORVASTATIN CALCIUM 10 MG PO TABS: 10.0000 mg | ORAL_TABLET | Freq: Every day | ORAL | 3 refills | Status: DC

## 2023-08-26 NOTE — Assessment & Plan Note (Signed)
-  Continue Lipitor °

## 2023-08-26 NOTE — Assessment & Plan Note (Signed)
Stable.  Continue current medications.

## 2023-08-26 NOTE — Assessment & Plan Note (Signed)
Has been stable.  A1c today.  Continue Mounjaro.

## 2023-08-26 NOTE — Patient Instructions (Addendum)
Continue your medications.  Cologuard ordered. Consider colonoscopy.  Follow up in 6 months

## 2023-08-26 NOTE — Progress Notes (Signed)
Subjective:  Patient ID: [name], female    DOB: 28-Mar-1966  Age: 57 y.o. MRN: [id number]  CC:  Follow up   HPI:  57 year old female presents for follow-up  Diabetes has been stable on Mounjaro.  She states that she has minimal side effects.  Last A1c 6.5.  Needs A1c today.  Needs foot exam today.  Hypertension stable on losartan/HCTZ.  Lipids have been fairly well-controlled on Lipitor.  Patient states that she is doing well.  No chest pain or shortness of breath.  Desires her flu vaccine today.  Patient Active Problem List   Diagnosis Date Noted   Seasonal allergies 08/26/2022   Genetic testing 07/05/2020   Obesity 03/30/2020   Diabetes mellitus without complication (HCC) 11/12/2019   Essential hypertension 11/12/2019   Hyperlipidemia 11/12/2019   Migraine 11/12/2019    Social Hx   Social History   Socioeconomic History   Marital status: Married    Spouse name: Public house manager    Number of children: 1   Years of education: Not on file   Highest education level: 12th grade  Occupational History   Not on file  Tobacco Use   Smoking status: Never   Smokeless tobacco: Never  Vaping Use   Vaping status: Never Used  Substance and Sexual Activity   Alcohol use: No   Drug use: No   Sexual activity: Yes    Comment: hysterectomy  Other Topics Concern   Not on file  Social History Narrative   Lives with husband [name] of 30 years.   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   Weapons in lock box     Social Determinants of Health   Financial Resource Strain: Medium Risk (08/22/2023)   Overall Financial Resource Strain (CARDIA)    Difficulty of Paying Living Expenses: Somewhat hard  Food Insecurity: No Food Insecurity  (08/22/2023)   Hunger Vital Sign    Worried About Running Out of Food in the Last Year: Never true    Ran Out of Food in the Last Year: Never true  Transportation Needs: No Transportation Needs (08/22/2023)   PRAPARE - Administrator, Civil Service (Medical): No    Lack of Transportation (Non-Medical): No  Physical Activity: Insufficiently Active (08/22/2023)   Exercise Vital Sign    Days of Exercise per Week: 2 days    Minutes of Exercise per Session: 40 min  Stress: No Stress Concern Present (08/22/2023)   [name] of Occupational Health - Occupational Stress Questionnaire    Feeling of Stress : Not at all  Social Connections: Socially Integrated (08/22/2023)   Social Connection and Isolation Panel [NHANES]    Frequency of Communication with Friends and Family: More than three times a week    Frequency of Social Gatherings with Friends and Family: More than three times a week    Attends Religious Services: 1 to 4 times per year    Active Member of [employer] or Organizations: Yes    Attends Banker Meetings: 1 to 4 times per year    Marital Status: Married  Review of Systems Per HPI  Objective:  BP 118/82   Pulse 92   Temp 98.2 F (36.8 C)   Ht 5\' 5"  (1.651 m)   Wt 219 lb 12.8 oz (99.7 kg)   SpO2 99%   BMI 36.58 kg/m      08/26/2023    1:07 PM 02/22/2023    2:11 PM 08/24/2022    2:45 PM  BP/Weight  Systolic BP 118 126 132  Diastolic BP 82 84 94  Wt. (Lbs) 219.8 220 224  BMI 36.58 kg/m2 36.61 kg/m2 36.15 kg/m2    Physical Exam Vitals and nursing note reviewed.  Constitutional:      General: She is not in acute distress.    Appearance: Normal appearance.  HENT:     Head: Normocephalic and atraumatic.  Eyes:     General:        Right eye: No discharge.        Left eye: No discharge.     Conjunctiva/sclera: Conjunctivae normal.  Cardiovascular:     Rate and Rhythm: Normal rate and regular rhythm.  Pulmonary:     Effort: Pulmonary  effort is normal.     Breath sounds: Normal breath sounds. No wheezing, rhonchi or rales.  Neurological:     Mental Status: She is alert.  Psychiatric:        Mood and Affect: Mood normal.        Behavior: Behavior normal.     Lab Results  Component Value Date   WBC 5.8 02/27/2023   HGB 15.2 02/27/2023   HCT 46.0 02/27/2023   PLT 230 02/27/2023   GLUCOSE 139 (H) 02/27/2023   CHOL 153 02/27/2023   TRIG 112 02/27/2023   HDL 56 02/27/2023   LDLCALC 77 02/27/2023   ALT 23 02/27/2023   AST 21 02/27/2023   NA 142 02/27/2023   K 4.4 02/27/2023   CL 101 02/27/2023   CREATININE 0.83 02/27/2023   BUN 23 02/27/2023   CO2 27 02/27/2023   TSH 2.020 06/09/2021   HGBA1C 6.5 (H) 02/27/2023     Assessment & Plan:   Problem List Items Addressed This Visit       Cardiovascular and Mediastinum   Essential hypertension    Stable.  Continue current medications.      Relevant Medications   atorvastatin (LIPITOR) 10 MG tablet   losartan-hydrochlorothiazide (HYZAAR) 100-25 MG tablet     Endocrine   Diabetes mellitus without complication (HCC) - Primary    Has been stable.  A1c today.  Continue Mounjaro.      Relevant Medications   atorvastatin (LIPITOR) 10 MG tablet   losartan-hydrochlorothiazide (HYZAAR) 100-25 MG tablet   Other Relevant Orders   Hemoglobin A1c     Other   Hyperlipidemia    Continue Lipitor      Relevant Medications   atorvastatin (LIPITOR) 10 MG tablet   losartan-hydrochlorothiazide (HYZAAR) 100-25 MG tablet   Other Visit Diagnoses     Immunization due       Relevant Orders   Flu vaccine trivalent PF, 6mos and older(Flulaval,Afluria,Fluarix,Fluzone)   Colon cancer screening       Relevant Orders   Cologuard       Meds ordered this encounter  Medications   atorvastatin (LIPITOR) 10 MG tablet    Sig: Take 1 tablet (10 mg total) by mouth daily.    Dispense:  90 tablet    Refill:  3   losartan-hydrochlorothiazide (HYZAAR) 100-25 MG tablet  Sig: Take 1 tablet by mouth daily.    Dispense:  90 tablet    Refill:  3    Follow-up:  Return in about 6 months (around 02/23/2024) for HTN follow up, Diabetes follow up.  Everlene Other DO [facility]

## 2023-08-28 LAB — HEMOGLOBIN A1C
Est. average glucose Bld gHb Est-mCnc: 146 mg/dL
Hgb A1c MFr Bld: 6.7 % — ABNORMAL HIGH (ref 4.8–5.6)

## 2023-09-10 LAB — COLOGUARD: COLOGUARD: NEGATIVE

## 2023-09-16 ENCOUNTER — Other Ambulatory Visit: Payer: Self-pay | Admitting: Family Medicine

## 2023-09-16 DIAGNOSIS — J302 Other seasonal allergic rhinitis: Secondary | ICD-10-CM

## 2023-09-21 ENCOUNTER — Other Ambulatory Visit: Payer: Self-pay | Admitting: Family Medicine

## 2023-09-23 ENCOUNTER — Other Ambulatory Visit (HOSPITAL_COMMUNITY): Payer: Self-pay

## 2023-09-23 MED ORDER — MOUNJARO 12.5 MG/0.5ML ~~LOC~~ SOAJ
12.5000 mg | SUBCUTANEOUS | 1 refills | Status: DC
  Filled 2023-09-23: qty 4, 56d supply, fill #0
  Filled 2023-11-13 – 2023-11-20 (×4): qty 2, 28d supply, fill #1
  Filled 2023-12-14: qty 2, 28d supply, fill #2

## 2023-09-25 ENCOUNTER — Other Ambulatory Visit (HOSPITAL_COMMUNITY): Payer: Self-pay

## 2023-10-17 ENCOUNTER — Other Ambulatory Visit: Payer: Self-pay | Admitting: Family Medicine

## 2023-11-14 ENCOUNTER — Other Ambulatory Visit (HOSPITAL_COMMUNITY): Payer: Self-pay

## 2023-11-15 ENCOUNTER — Other Ambulatory Visit (HOSPITAL_COMMUNITY): Payer: Self-pay

## 2023-11-16 ENCOUNTER — Other Ambulatory Visit (HOSPITAL_COMMUNITY): Payer: Self-pay

## 2023-11-18 ENCOUNTER — Other Ambulatory Visit (HOSPITAL_COMMUNITY): Payer: Self-pay

## 2023-11-20 ENCOUNTER — Other Ambulatory Visit (HOSPITAL_COMMUNITY): Payer: Self-pay

## 2023-11-20 ENCOUNTER — Telehealth: Payer: Self-pay | Admitting: *Deleted

## 2023-11-20 NOTE — Telephone Encounter (Signed)
 PA for Mounjaro : Your request has been approved CaseId:[id number];Status:Approved;Review Type:Prior Auth;Coverage Start Date:10/21/2023;Coverage End Date:11/19/2024;

## 2023-11-21 ENCOUNTER — Other Ambulatory Visit (HOSPITAL_COMMUNITY): Payer: Self-pay

## 2023-11-21 LAB — HM DIABETES EYE EXAM

## 2023-12-16 ENCOUNTER — Other Ambulatory Visit (HOSPITAL_COMMUNITY): Payer: Self-pay

## 2023-12-17 ENCOUNTER — Telehealth: Admitting: Family Medicine

## 2023-12-17 DIAGNOSIS — R052 Subacute cough: Secondary | ICD-10-CM | POA: Diagnosis not present

## 2023-12-17 MED ORDER — PREDNISONE 50 MG PO TABS: 50.0000 mg | ORAL_TABLET | Freq: Every day | ORAL | 0 refills | Status: AC

## 2023-12-17 MED ORDER — PROMETHAZINE-DM 6.25-15 MG/5ML PO SYRP: 5.0000 mL | ORAL_SOLUTION | Freq: Four times a day (QID) | ORAL | 0 refills | Status: DC | PRN

## 2023-12-17 NOTE — Progress Notes (Signed)
 Virtual Visit via Video Note  I connected with Nilsa Macht Roycroft on 12/17/23 at  3:50 PM EST by a video enabled telemedicine application and verified that I am speaking with the correct person using two identifiers.  Location: Patient: Home Provider: Office   I discussed the limitations of evaluation and management by telemedicine and the availability of in person appointments. The patient expressed understanding and agreed to proceed.  History of Present Illness:  58 year old female presents for evaluation of cough.  Patient reports that she had fairly recent upper respiratory infection.  She states that she has improved but continues to have cough and chest congestion.  This has been ongoing for the past 3 weeks.  She states that she cannot seem to make it over the hump.  No fever.  No shortness of breath.  Has used albuterol with some improvement.  Observations/Objective: General: Speaking in full sentences.  No distress. Respiratory: No cough during history.  Speaking full sentences.  No respiratory distress.  Assessment and Plan: 58 year old female presents with subacute cough.  Treating with prednisone and Promethazine DM.  Follow Up Instructions:    I discussed the assessment and treatment plan with the patient. The patient was provided an opportunity to ask questions and all were answered. The patient agreed with the plan and demonstrated an understanding of the instructions.   The patient was advised to call back or seek an in-person evaluation if the symptoms worsen or if the condition fails to improve as anticipated.  I provided 5 minutes of non-face-to-face time during this encounter.   Tommie Sams, DO

## 2024-01-17 ENCOUNTER — Other Ambulatory Visit: Payer: Self-pay | Admitting: Family Medicine

## 2024-01-21 ENCOUNTER — Other Ambulatory Visit: Payer: Self-pay | Admitting: Family Medicine

## 2024-01-21 ENCOUNTER — Other Ambulatory Visit (HOSPITAL_COMMUNITY): Payer: Self-pay

## 2024-01-21 MED ORDER — MOUNJARO 12.5 MG/0.5ML ~~LOC~~ SOAJ
12.5000 mg | SUBCUTANEOUS | 1 refills | Status: DC
  Filled 2024-01-21: qty 2, 28d supply, fill #0
  Filled 2024-02-14: qty 2, 28d supply, fill #1
  Filled 2024-03-13: qty 2, 28d supply, fill #2
  Filled 2024-04-09: qty 2, 28d supply, fill #3

## 2024-01-22 ENCOUNTER — Other Ambulatory Visit (HOSPITAL_COMMUNITY): Payer: Self-pay

## 2024-02-14 ENCOUNTER — Other Ambulatory Visit (HOSPITAL_COMMUNITY): Payer: Self-pay

## 2024-02-24 ENCOUNTER — Ambulatory Visit (INDEPENDENT_AMBULATORY_CARE_PROVIDER_SITE_OTHER): Payer: Managed Care, Other (non HMO) | Admitting: Family Medicine

## 2024-02-24 VITALS — BP 114/81 | HR 68 | Temp 97.3°F | Ht 65.0 in | Wt 220.0 lb

## 2024-02-24 DIAGNOSIS — Z7985 Long-term (current) use of injectable non-insulin antidiabetic drugs: Secondary | ICD-10-CM | POA: Diagnosis not present

## 2024-02-24 DIAGNOSIS — E1159 Type 2 diabetes mellitus with other circulatory complications: Secondary | ICD-10-CM

## 2024-02-24 DIAGNOSIS — G43809 Other migraine, not intractable, without status migrainosus: Secondary | ICD-10-CM | POA: Diagnosis not present

## 2024-02-24 DIAGNOSIS — E782 Mixed hyperlipidemia: Secondary | ICD-10-CM | POA: Diagnosis not present

## 2024-02-24 DIAGNOSIS — E119 Type 2 diabetes mellitus without complications: Secondary | ICD-10-CM

## 2024-02-24 DIAGNOSIS — I10 Essential (primary) hypertension: Secondary | ICD-10-CM

## 2024-02-24 DIAGNOSIS — Z130 Encounter for screening for diseases of the blood and blood-forming organs and certain disorders involving the immune mechanism: Secondary | ICD-10-CM

## 2024-02-24 MED ORDER — SUMATRIPTAN SUCCINATE 100 MG PO TABS
ORAL_TABLET | ORAL | 3 refills | Status: AC
  Filled 2024-08-28: qty 9, 30d supply, fill #0

## 2024-02-24 NOTE — Patient Instructions (Signed)
 Labs at your convenience.  Follow up in 6 months.

## 2024-02-25 NOTE — Assessment & Plan Note (Signed)
 Continue statin.

## 2024-02-25 NOTE — Assessment & Plan Note (Signed)
Stable.  Imitrex refilled. 

## 2024-02-25 NOTE — Progress Notes (Signed)
 Subjective:  Patient ID: [name], female    DOB: 04/08/1966  Age: 58 y.o. MRN: [id number]  CC:   Chief Complaint  Patient presents with   Follow-up    6 month f/u DM     HPI:  58 year old female with below-mentioned medical problems presents for follow-up.  Patient reports that overall she is doing well.  She is stable on losartan /HCTZ.  Type 2 diabetes stable on Mounjaro .  Needs A1c today.  Most recent LDL 77.  Patient on Lipitor and tolerating.  Patient needs a refill on her migraine medication.  Overall stable.  Patient Active Problem List   Diagnosis Date Noted   Seasonal allergies 08/26/2022   Genetic testing 07/05/2020   Obesity 03/30/2020   Diabetes mellitus without complication (HCC) 11/12/2019   Essential hypertension 11/12/2019   Hyperlipidemia 11/12/2019   Migraine 11/12/2019    Social Hx   Social History   Socioeconomic History   Marital status: Married    Spouse name: Public house manager    Number of children: 1   Years of education: Not on file   Highest education level: Some college, no degree  Occupational History   Not on file  Tobacco Use   Smoking status: Never   Smokeless tobacco: Never  Vaping Use   Vaping status: Never Used  Substance and Sexual Activity   Alcohol use: No   Drug use: No   Sexual activity: Yes    Comment: hysterectomy  Other Topics Concern   Not on file  Social History Narrative   Lives with husband Rozelle Corning of 30 years.   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   Weapons in lock box     Social Drivers of Health   Financial Resource Strain: Low Risk  (02/24/2024)   Overall Financial Resource Strain (CARDIA)    Difficulty of Paying Living Expenses: Not very hard  Food  Insecurity: No Food Insecurity (02/24/2024)   Hunger Vital Sign    Worried About Running Out of Food in the Last Year: Never true    Ran Out of Food in the Last Year: Never true  Transportation Needs: No Transportation Needs (02/24/2024)   PRAPARE - Administrator, Civil Service (Medical): No    Lack of Transportation (Non-Medical): No  Physical Activity: Insufficiently Active (02/24/2024)   Exercise Vital Sign    Days of Exercise per Week: 2 days    Minutes of Exercise per Session: 40 min  Stress: No Stress Concern Present (02/24/2024)   [employer] of Occupational Health - Occupational Stress Questionnaire    Feeling of Stress : Not at all  Social Connections: Socially Integrated (02/24/2024)   Social Connection and Isolation Panel [NHANES]    Frequency of Communication with Friends and Family: Twice a week    Frequency of Social Gatherings with Friends and Family: Once a week    Attends Religious Services: More than 4 times per year    Active Member of [employer] or Organizations: Yes    Attends Banker Meetings: More than 4 times per year    Marital  Status: Married    Review of Systems Per HPI  Objective:  BP 114/81   Pulse 68   Temp (!) 97.3 F (36.3 C)   Ht 5\' 5"  (1.651 m)   Wt 220 lb (99.8 kg)   SpO2 96%   BMI 36.61 kg/m      02/24/2024    1:07 PM 08/26/2023    1:07 PM 02/22/2023    2:11 PM  BP/Weight  Systolic BP 114 118 126  Diastolic BP 81 82 84  Wt. (Lbs) 220 219.8 220  BMI 36.61 kg/m2 36.58 kg/m2 36.61 kg/m2    Physical Exam Vitals and nursing note reviewed.  Constitutional:      General: She is not in acute distress.    Appearance: Normal appearance.  HENT:     Head: Normocephalic and atraumatic.  Eyes:     General:        Right eye: No discharge.        Left eye: No discharge.     Conjunctiva/sclera: Conjunctivae normal.  Cardiovascular:     Rate and Rhythm: Normal rate and regular rhythm.  Pulmonary:     Effort:  Pulmonary effort is normal.     Breath sounds: Normal breath sounds. No wheezing, rhonchi or rales.  Neurological:     Mental Status: She is alert.  Psychiatric:        Mood and Affect: Mood normal.        Behavior: Behavior normal.     Lab Results  Component Value Date   WBC 5.8 02/27/2023   HGB 15.2 02/27/2023   HCT 46.0 02/27/2023   PLT 230 02/27/2023   GLUCOSE 139 (H) 02/27/2023   CHOL 153 02/27/2023   TRIG 112 02/27/2023   HDL 56 02/27/2023   LDLCALC 77 02/27/2023   ALT 23 02/27/2023   AST 21 02/27/2023   NA 142 02/27/2023   K 4.4 02/27/2023   CL 101 02/27/2023   CREATININE 0.83 02/27/2023   BUN 23 02/27/2023   CO2 27 02/27/2023   TSH 2.020 06/09/2021   HGBA1C 6.7 (H) 08/27/2023     Assessment & Plan:  Diabetes mellitus without complication (HCC) Assessment & Plan: Stable. A1c today. Continue Mounjaro .  Orders: -     CMP14+EGFR -     Hemoglobin A1c -     Microalbumin / creatinine urine ratio  Mixed hyperlipidemia Assessment & Plan: Continue statin.  Orders: -     Lipid panel  Screening for deficiency anemia -     CBC  Other migraine without status migrainosus, not intractable Assessment & Plan: Stable. Imitrex  refilled.   Essential hypertension Assessment & Plan: Stable. Labs today. Continue Losartan /hydrochlorothiazide .   Other orders -     SUMAtriptan  Succinate; 1 tablet at the onset of migraine. May repeat in 2 hours if needed.  Dispense: 10 tablet; Refill: 3    Follow-up:  6 months  Easton Fetty Debrah Fan DO [facility]

## 2024-02-25 NOTE — Assessment & Plan Note (Signed)
 Stable. A1c today. Continue Mounjaro .

## 2024-02-25 NOTE — Assessment & Plan Note (Signed)
 Stable. Labs today. Continue Losartan /hydrochlorothiazide .

## 2024-03-04 LAB — CMP14+EGFR
ALT: 19 IU/L (ref 0–32)
AST: 23 IU/L (ref 0–40)
Albumin: 4.5 g/dL (ref 3.8–4.9)
Alkaline Phosphatase: 79 IU/L (ref 44–121)
BUN/Creatinine Ratio: 34 — ABNORMAL HIGH (ref 12–28)
BUN: 25 mg/dL (ref 8–27)
Bilirubin Total: 0.6 mg/dL (ref 0.0–1.2)
CO2: 23 mmol/L (ref 20–29)
Calcium: 10 mg/dL (ref 8.7–10.3)
Chloride: 103 mmol/L (ref 96–106)
Creatinine, Ser: 0.73 mg/dL (ref 0.57–1.00)
Globulin, Total: 2.5 g/dL (ref 1.5–4.5)
Glucose: 140 mg/dL — ABNORMAL HIGH (ref 70–99)
Potassium: 4.1 mmol/L (ref 3.5–5.2)
Sodium: 142 mmol/L (ref 134–144)
Total Protein: 7 g/dL (ref 6.0–8.5)
eGFR: 94 mL/min/{1.73_m2} (ref >59)

## 2024-03-04 LAB — CBC
Hematocrit: 45.6 % (ref 34.0–46.6)
Hemoglobin: 14.9 g/dL (ref 11.1–15.9)
MCH: 28.5 pg (ref 26.6–33.0)
MCHC: 32.7 g/dL (ref 31.5–35.7)
MCV: 87 fL (ref 79–97)
Platelets: 220 10*3/uL (ref 150–450)
RBC: 5.22 x10E6/uL (ref 3.77–5.28)
RDW: 12.9 % (ref 11.7–15.4)
WBC: 5.7 10*3/uL (ref 3.4–10.8)

## 2024-03-04 LAB — MICROALBUMIN / CREATININE URINE RATIO
Creatinine, Urine: 222.2 mg/dL
Microalb/Creat Ratio: 12 mg/g{creat} (ref 0–29)
Microalbumin, Urine: 27.2 ug/mL

## 2024-03-04 LAB — LIPID PANEL
Chol/HDL Ratio: 2.7 ratio (ref 0.0–4.4)
Cholesterol, Total: 152 mg/dL (ref 100–199)
HDL: 57 mg/dL (ref >39)
LDL Chol Calc (NIH): 76 mg/dL (ref 0–99)
Triglycerides: 106 mg/dL (ref 0–149)
VLDL Cholesterol Cal: 19 mg/dL (ref 5–40)

## 2024-03-04 LAB — HEMOGLOBIN A1C
Est. average glucose Bld gHb Est-mCnc: 146 mg/dL
Hgb A1c MFr Bld: 6.7 % — ABNORMAL HIGH (ref 4.8–5.6)

## 2024-03-09 ENCOUNTER — Ambulatory Visit: Payer: Self-pay | Admitting: Family Medicine

## 2024-03-13 ENCOUNTER — Other Ambulatory Visit (HOSPITAL_COMMUNITY): Payer: Self-pay

## 2024-03-16 ENCOUNTER — Other Ambulatory Visit (HOSPITAL_COMMUNITY): Payer: Self-pay

## 2024-03-17 ENCOUNTER — Other Ambulatory Visit (HOSPITAL_COMMUNITY): Payer: Self-pay

## 2024-04-09 ENCOUNTER — Other Ambulatory Visit (HOSPITAL_COMMUNITY): Payer: Self-pay

## 2024-04-13 ENCOUNTER — Other Ambulatory Visit (HOSPITAL_COMMUNITY): Payer: Self-pay

## 2024-05-08 ENCOUNTER — Other Ambulatory Visit: Payer: Self-pay | Admitting: Family Medicine

## 2024-05-09 ENCOUNTER — Other Ambulatory Visit: Payer: Self-pay | Admitting: Family Medicine

## 2024-05-11 ENCOUNTER — Other Ambulatory Visit: Payer: Self-pay

## 2024-05-11 ENCOUNTER — Other Ambulatory Visit (HOSPITAL_COMMUNITY): Payer: Self-pay

## 2024-05-11 MED ORDER — MOUNJARO 12.5 MG/0.5ML ~~LOC~~ SOAJ
12.5000 mg | SUBCUTANEOUS | 1 refills | Status: DC
  Filled 2024-05-11: qty 4, 56d supply, fill #0
  Filled 2024-07-02: qty 2, 28d supply, fill #1
  Filled 2024-07-30: qty 2, 28d supply, fill #0

## 2024-07-02 ENCOUNTER — Other Ambulatory Visit (HOSPITAL_COMMUNITY): Payer: Self-pay

## 2024-07-03 ENCOUNTER — Other Ambulatory Visit (HOSPITAL_COMMUNITY): Payer: Self-pay

## 2024-07-27 ENCOUNTER — Other Ambulatory Visit (HOSPITAL_BASED_OUTPATIENT_CLINIC_OR_DEPARTMENT_OTHER): Payer: Self-pay

## 2024-07-28 ENCOUNTER — Other Ambulatory Visit (HOSPITAL_BASED_OUTPATIENT_CLINIC_OR_DEPARTMENT_OTHER): Payer: Self-pay

## 2024-07-30 ENCOUNTER — Other Ambulatory Visit: Payer: Self-pay | Admitting: Family Medicine

## 2024-07-30 ENCOUNTER — Other Ambulatory Visit (HOSPITAL_BASED_OUTPATIENT_CLINIC_OR_DEPARTMENT_OTHER): Payer: Self-pay

## 2024-08-03 ENCOUNTER — Other Ambulatory Visit (HOSPITAL_BASED_OUTPATIENT_CLINIC_OR_DEPARTMENT_OTHER): Payer: Self-pay

## 2024-08-03 MED ORDER — CETIRIZINE HCL 10 MG PO TABS
10.0000 mg | ORAL_TABLET | Freq: Every day | ORAL | 2 refills | Status: DC
  Filled 2024-08-03: qty 30, 30d supply, fill #0
  Filled 2024-08-28: qty 30, 30d supply, fill #1
  Filled 2024-09-27: qty 30, 30d supply, fill #2

## 2024-08-26 ENCOUNTER — Ambulatory Visit: Admitting: Family Medicine

## 2024-08-26 ENCOUNTER — Encounter: Payer: Self-pay | Admitting: Family Medicine

## 2024-08-26 VITALS — BP 144/80 | HR 85 | Ht 66.0 in | Wt 219.0 lb

## 2024-08-26 DIAGNOSIS — I10 Essential (primary) hypertension: Secondary | ICD-10-CM | POA: Diagnosis not present

## 2024-08-26 DIAGNOSIS — E782 Mixed hyperlipidemia: Secondary | ICD-10-CM

## 2024-08-26 DIAGNOSIS — Z7985 Long-term (current) use of injectable non-insulin antidiabetic drugs: Secondary | ICD-10-CM

## 2024-08-26 DIAGNOSIS — Z23 Encounter for immunization: Secondary | ICD-10-CM | POA: Diagnosis not present

## 2024-08-26 DIAGNOSIS — E119 Type 2 diabetes mellitus without complications: Secondary | ICD-10-CM | POA: Diagnosis not present

## 2024-08-26 NOTE — Patient Instructions (Addendum)
Labs today.  Follow up in 6 months.  Take care  Dr. Alyxis Grippi  

## 2024-08-26 NOTE — Assessment & Plan Note (Signed)
 A1c today. Continue Mounjaro .

## 2024-08-26 NOTE — Progress Notes (Signed)
 Subjective:  Patient ID: [name], female    DOB: 09-22-1966  Age: 58 y.o. MRN: [id number]  CC:   Chief Complaint  Patient presents with   Diabetes    Six month follow up   Hyperlipidemia    Follow up, patient would like to come off statins and placed on another medication     HPI:  58 year old female with HTN, Migraine, DM-2, Obesity, HLD presents for follow up.  Patient reports that she has discontinued statin primarily due to concerns for increased risk of dementia. She has not taken in 2 months. Will discuss today. Last LDL was 76.   BP mildly elevated here today. She is compliant with Losartan /hydrochlorothiazide .  A1c has been at goal on Mounjaro  12.5 mg. Needs A1c today.   In regards to her preventative care, she is in need of flu and pneumonia vaccines. Amendable to getting these today.   Patient Active Problem List   Diagnosis Date Noted   Seasonal allergies 08/26/2022   Genetic testing 07/05/2020   Obesity 03/30/2020   Diabetes mellitus without complication (HCC) 11/12/2019   Essential hypertension 11/12/2019   Hyperlipidemia 11/12/2019   Migraine 11/12/2019    Social Hx   Social History   Socioeconomic History   Marital status: Married    Spouse name: Public House Manager    Number of children: 1   Years of education: Not on file   Highest education level: 12th grade  Occupational History   Not on file  Tobacco Use   Smoking status: Never   Smokeless tobacco: Never  Vaping Use   Vaping status: Never Used  Substance and Sexual Activity   Alcohol use: No   Drug use: No   Sexual activity: Yes    Comment: hysterectomy  Other Topics Concern   Not on file  Social History Narrative   Lives with husband Addie of 30 years.   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   Weapons in lock box     Social Drivers of Health   Financial Resource Strain: Low Risk  (08/25/2024)   Overall Financial Resource Strain (CARDIA)    Difficulty of Paying Living Expenses: Not very hard  Food Insecurity: No Food Insecurity (08/25/2024)   Hunger Vital Sign    Worried About Running Out of Food in the Last Year: Never true    Ran Out of Food in the Last Year: Never true  Transportation Needs: No Transportation Needs (08/25/2024)   PRAPARE - Administrator, Civil Service (Medical): No    Lack of Transportation (Non-Medical): No  Physical Activity: Insufficiently Active (08/25/2024)   Exercise Vital Sign    Days of Exercise per Week: 2 days    Minutes of Exercise per Session: 30 min  Stress: No Stress Concern Present (08/25/2024)   [employer] of Occupational Health - Occupational Stress Questionnaire    Feeling of Stress: Not at all  Social Connections: Socially Integrated (08/25/2024)   Social Connection and Isolation Panel    Frequency of Communication with Friends and Family: Three times a week  Frequency of Social Gatherings with Friends and Family: Once a week    Attends Religious Services: More than 4 times per year    Active Member of [employer] or Organizations: Yes    Attends Engineer, Structural: More than 4 times per year    Marital Status: Married    Review of Systems Per HPI  Objective:  BP (!) 144/80   Pulse 85   Ht 5' 6 (1.676 m)   Wt 219 lb (99.3 kg)   SpO2 98%   BMI 35.35 kg/m      08/26/2024    1:05 PM 02/24/2024    1:07 PM 08/26/2023    1:07 PM  BP/Weight  Systolic BP 144 114 118  Diastolic BP 80 81 82  Wt. (Lbs) 219 220 219.8  BMI 35.35 kg/m2 36.61 kg/m2 36.58 kg/m2    Physical Exam Vitals and nursing note reviewed.  Constitutional:      General: She is not in acute distress.    Appearance: Normal appearance. She is obese.  HENT:     Head:  Normocephalic and atraumatic.  Cardiovascular:     Rate and Rhythm: Normal rate and regular rhythm.  Pulmonary:     Effort: Pulmonary effort is normal.     Breath sounds: Normal breath sounds.  Neurological:     Mental Status: She is alert.  Psychiatric:        Mood and Affect: Mood normal.        Behavior: Behavior normal.     Lab Results  Component Value Date   WBC 5.7 03/03/2024   HGB 14.9 03/03/2024   HCT 45.6 03/03/2024   PLT 220 03/03/2024   GLUCOSE 140 (H) 03/03/2024   CHOL 152 03/03/2024   TRIG 106 03/03/2024   HDL 57 03/03/2024   LDLCALC 76 03/03/2024   ALT 19 03/03/2024   AST 23 03/03/2024   NA 142 03/03/2024   K 4.1 03/03/2024   CL 103 03/03/2024   CREATININE 0.73 03/03/2024   BUN 25 03/03/2024   CO2 23 03/03/2024   TSH 2.020 06/09/2021   HGBA1C 6.7 (H) 03/03/2024     Assessment & Plan:  Essential hypertension Assessment & Plan: Has been well controlled but mildly elevated today. No changes today. Continue current medications.    Encounter for immunization -     Flu vaccine trivalent PF, 6mos and older(Flulaval,Afluria,Fluarix,Fluzone) -     Pneumococcal conjugate vaccine 20-valent  Mixed hyperlipidemia Assessment & Plan: Fairly well controlled but now is off statin. Extensive discussion regarding this. I feel that should she continue.  For now, we will discontinue and reassess lipids.  Orders: -     Lipid panel  Diabetes mellitus without complication (HCC) Assessment & Plan: A1c today. Continue Mounjaro .   Orders: -     Hemoglobin A1c    Follow-up:  6 months  Gisella Alwine Bluford DO [facility]

## 2024-08-26 NOTE — Assessment & Plan Note (Signed)
 Has been well controlled but mildly elevated today. No changes today. Continue current medications.

## 2024-08-26 NOTE — Assessment & Plan Note (Signed)
 Fairly well controlled but now is off statin. Extensive discussion regarding this. I feel that should she continue.  For now, we will discontinue and reassess lipids.

## 2024-08-27 ENCOUNTER — Ambulatory Visit: Payer: Self-pay | Admitting: Family Medicine

## 2024-08-27 LAB — LIPID PANEL
Chol/HDL Ratio: 3.4 ratio (ref 0.0–4.4)
Cholesterol, Total: 191 mg/dL (ref 100–199)
HDL: 57 mg/dL (ref >39)
LDL Chol Calc (NIH): 110 mg/dL — ABNORMAL HIGH (ref 0–99)
Triglycerides: 136 mg/dL (ref 0–149)
VLDL Cholesterol Cal: 24 mg/dL (ref 5–40)

## 2024-08-27 LAB — HEMOGLOBIN A1C
Est. average glucose Bld gHb Est-mCnc: 151 mg/dL
Hgb A1c MFr Bld: 6.9 % — ABNORMAL HIGH (ref 4.8–5.6)

## 2024-08-28 ENCOUNTER — Other Ambulatory Visit (HOSPITAL_BASED_OUTPATIENT_CLINIC_OR_DEPARTMENT_OTHER): Payer: Self-pay

## 2024-08-28 ENCOUNTER — Other Ambulatory Visit: Payer: Self-pay | Admitting: Family Medicine

## 2024-08-28 MED ORDER — MOUNJARO 12.5 MG/0.5ML ~~LOC~~ SOAJ
12.5000 mg | SUBCUTANEOUS | 1 refills | Status: AC
  Filled 2024-08-28: qty 2, 28d supply, fill #0
  Filled 2024-09-27: qty 2, 28d supply, fill #1
  Filled 2024-10-22: qty 2, 28d supply, fill #2
  Filled 2024-11-19: qty 2, 28d supply, fill #3

## 2024-09-08 ENCOUNTER — Other Ambulatory Visit (HOSPITAL_BASED_OUTPATIENT_CLINIC_OR_DEPARTMENT_OTHER): Payer: Self-pay

## 2024-09-08 ENCOUNTER — Other Ambulatory Visit: Payer: Self-pay | Admitting: Family Medicine

## 2024-09-08 MED ORDER — LOSARTAN POTASSIUM-HCTZ 100-25 MG PO TABS
1.0000 | ORAL_TABLET | Freq: Every day | ORAL | 3 refills | Status: AC
  Filled 2024-09-08: qty 90, 90d supply, fill #0

## 2024-09-09 ENCOUNTER — Other Ambulatory Visit: Payer: Self-pay | Admitting: Family Medicine

## 2024-09-09 ENCOUNTER — Other Ambulatory Visit (HOSPITAL_BASED_OUTPATIENT_CLINIC_OR_DEPARTMENT_OTHER): Payer: Self-pay

## 2024-09-09 DIAGNOSIS — E782 Mixed hyperlipidemia: Secondary | ICD-10-CM

## 2024-09-11 ENCOUNTER — Other Ambulatory Visit: Payer: Self-pay

## 2024-09-11 ENCOUNTER — Other Ambulatory Visit (HOSPITAL_BASED_OUTPATIENT_CLINIC_OR_DEPARTMENT_OTHER): Payer: Self-pay

## 2024-09-14 ENCOUNTER — Telehealth: Payer: Self-pay | Admitting: Family Medicine

## 2024-09-14 ENCOUNTER — Other Ambulatory Visit (HOSPITAL_BASED_OUTPATIENT_CLINIC_OR_DEPARTMENT_OTHER): Payer: Self-pay

## 2024-09-14 NOTE — Telephone Encounter (Unsigned)
 Copied from CRM [id number]. Topic: Clinical - Medication Refill >> Sep 14, 2024 11:11 AM Avram MATSU wrote: Medication: atorvastatin  (LIPITOR) 10 MG tablet [[id number]]  Has the patient contacted their pharmacy? Yes (Agent: If no, request that the patient contact the pharmacy for the refill. If patient does not wish to contact the pharmacy document the reason why and proceed with request.) (Agent: If yes, when and what did the pharmacy advise?)  This is the patient's preferred pharmacy:  [facility] [address] [place] [address] Phone: [phone] Fax: [phone]  Is this the correct pharmacy for this prescription? Yes If no, delete pharmacy and type the correct one.   Has the prescription been filled recently? No  Is the patient out of the medication? No  Has the patient been seen for an appointment in the last year OR does the patient have an upcoming appointment? Yes  Can we respond through MyChart? Yes  Agent: Please be advised that Rx refills may take up to 3 business days. We ask that you follow-up with your pharmacy.

## 2024-09-15 ENCOUNTER — Other Ambulatory Visit (HOSPITAL_BASED_OUTPATIENT_CLINIC_OR_DEPARTMENT_OTHER): Payer: Self-pay

## 2024-09-15 MED ORDER — ATORVASTATIN CALCIUM 10 MG PO TABS
10.0000 mg | ORAL_TABLET | Freq: Every day | ORAL | 3 refills | Status: AC
  Filled 2024-09-15: qty 90, 90d supply, fill #0

## 2024-09-15 NOTE — Telephone Encounter (Signed)
 atorvastatin  (LIPITOR) 10 MG tablet [[id number]] -- not on active med list

## 2024-09-28 ENCOUNTER — Other Ambulatory Visit (HOSPITAL_BASED_OUTPATIENT_CLINIC_OR_DEPARTMENT_OTHER): Payer: Self-pay

## 2024-10-22 ENCOUNTER — Other Ambulatory Visit: Payer: Self-pay | Admitting: Family Medicine

## 2024-10-22 ENCOUNTER — Other Ambulatory Visit (HOSPITAL_BASED_OUTPATIENT_CLINIC_OR_DEPARTMENT_OTHER): Payer: Self-pay

## 2024-10-22 ENCOUNTER — Other Ambulatory Visit (HOSPITAL_COMMUNITY): Payer: Self-pay

## 2024-10-22 ENCOUNTER — Telehealth: Payer: Self-pay | Admitting: Pharmacy Technician

## 2024-10-22 DIAGNOSIS — J302 Other seasonal allergic rhinitis: Secondary | ICD-10-CM

## 2024-10-22 MED ORDER — FLUTICASONE PROPIONATE 50 MCG/ACT NA SUSP
2.0000 | Freq: Every day | NASAL | 6 refills | Status: AC
  Filled 2024-10-22: qty 16, 30d supply, fill #0

## 2024-10-22 NOTE — Telephone Encounter (Signed)
 Pharmacy Patient Advocate Encounter  Received notification from [facility] that Prior Authorization for Mounjaro  12.5mg  has been APPROVED from 10/15/24 to 10/22/25   PA #/Case ID/Reference #: [id number]

## 2024-10-22 NOTE — Telephone Encounter (Signed)
 Pharmacy Patient Advocate Encounter   Received notification from [employer] KEY that prior authorization for Mounjaro  12.5mg  is due for renewal. (Currently RX goes through. - unable to see expiration date - went ahead with PA)   Insurance verification completed.   The patient is insured through [employer].  Action: PA required; PA submitted to above mentioned insurance via Latent Key/confirmation #/EOC [facility] Status is pending

## 2024-10-27 ENCOUNTER — Other Ambulatory Visit (HOSPITAL_BASED_OUTPATIENT_CLINIC_OR_DEPARTMENT_OTHER): Payer: Self-pay

## 2024-10-27 ENCOUNTER — Other Ambulatory Visit: Payer: Self-pay | Admitting: Family Medicine

## 2024-10-27 MED ORDER — CETIRIZINE HCL 10 MG PO TABS
10.0000 mg | ORAL_TABLET | Freq: Every day | ORAL | 2 refills | Status: AC
  Filled 2024-10-27: qty 30, 30d supply, fill #0

## 2024-11-11 ENCOUNTER — Other Ambulatory Visit (HOSPITAL_BASED_OUTPATIENT_CLINIC_OR_DEPARTMENT_OTHER): Payer: Self-pay

## 2024-11-19 ENCOUNTER — Other Ambulatory Visit (HOSPITAL_BASED_OUTPATIENT_CLINIC_OR_DEPARTMENT_OTHER): Payer: Self-pay

## 2025-02-23 ENCOUNTER — Ambulatory Visit: Admitting: Family Medicine
